# Patient Record
Sex: Male | Born: 1945 | Race: White | Hispanic: No | Smoking: Former smoker
Health system: Southern US, Community
[De-identification: ages and names within clinical notes are randomized; demographics above are authoritative.]

## PROBLEM LIST (undated history)

## (undated) DIAGNOSIS — Z889 Allergy status to unspecified drugs, medicaments and biological substances status: Secondary | ICD-10-CM

## (undated) DIAGNOSIS — I499 Cardiac arrhythmia, unspecified: Secondary | ICD-10-CM

## (undated) DIAGNOSIS — E119 Type 2 diabetes mellitus without complications: Secondary | ICD-10-CM

## (undated) DIAGNOSIS — Z9289 Personal history of other medical treatment: Secondary | ICD-10-CM

## (undated) DIAGNOSIS — C61 Malignant neoplasm of prostate: Secondary | ICD-10-CM

## (undated) DIAGNOSIS — J45909 Unspecified asthma, uncomplicated: Secondary | ICD-10-CM

## (undated) DIAGNOSIS — J449 Chronic obstructive pulmonary disease, unspecified: Secondary | ICD-10-CM

## (undated) DIAGNOSIS — M25519 Pain in unspecified shoulder: Secondary | ICD-10-CM

## (undated) DIAGNOSIS — J189 Pneumonia, unspecified organism: Secondary | ICD-10-CM

## (undated) DIAGNOSIS — M5430 Sciatica, unspecified side: Secondary | ICD-10-CM

## (undated) HISTORY — PX: OTHER SURGICAL HISTORY: SHX169

## (undated) HISTORY — PX: APPENDECTOMY: SHX54

## (undated) HISTORY — PX: ESOPHAGOGASTRODUODENOSCOPY ENDOSCOPY: SHX5814

## (undated) HISTORY — PX: TONSILLECTOMY: SUR1361

## (undated) HISTORY — PX: PROSTATE SURGERY: SHX751

---

## 2011-01-20 ENCOUNTER — Inpatient Hospital Stay (HOSPITAL_COMMUNITY)
Admission: AD | Admit: 2011-01-20 | Discharge: 2011-01-28 | DRG: 682 | Disposition: A | Discharge status: Home Health | Payer: Medicare Other | Source: Other Acute Inpatient Hospital | Attending: Internal Medicine | Admitting: Internal Medicine

## 2011-01-20 ENCOUNTER — Inpatient Hospital Stay (HOSPITAL_COMMUNITY): Payer: Medicare Other

## 2011-01-20 DIAGNOSIS — J449 Chronic obstructive pulmonary disease, unspecified: Secondary | ICD-10-CM | POA: Diagnosis present

## 2011-01-20 DIAGNOSIS — K7030 Alcoholic cirrhosis of liver without ascites: Secondary | ICD-10-CM | POA: Diagnosis present

## 2011-01-20 DIAGNOSIS — E875 Hyperkalemia: Secondary | ICD-10-CM | POA: Diagnosis present

## 2011-01-20 DIAGNOSIS — N189 Chronic kidney disease, unspecified: Secondary | ICD-10-CM | POA: Diagnosis present

## 2011-01-20 DIAGNOSIS — F1010 Alcohol abuse, uncomplicated: Secondary | ICD-10-CM | POA: Diagnosis present

## 2011-01-20 DIAGNOSIS — K651 Peritoneal abscess: Secondary | ICD-10-CM | POA: Diagnosis present

## 2011-01-20 DIAGNOSIS — D62 Acute posthemorrhagic anemia: Secondary | ICD-10-CM | POA: Diagnosis present

## 2011-01-20 DIAGNOSIS — K5900 Constipation, unspecified: Secondary | ICD-10-CM | POA: Diagnosis present

## 2011-01-20 DIAGNOSIS — T50995A Adverse effect of other drugs, medicaments and biological substances, initial encounter: Secondary | ICD-10-CM | POA: Diagnosis present

## 2011-01-20 DIAGNOSIS — E119 Type 2 diabetes mellitus without complications: Secondary | ICD-10-CM | POA: Diagnosis present

## 2011-01-20 DIAGNOSIS — L02219 Cutaneous abscess of trunk, unspecified: Secondary | ICD-10-CM | POA: Diagnosis present

## 2011-01-20 DIAGNOSIS — K560 Paralytic ileus: Secondary | ICD-10-CM | POA: Diagnosis present

## 2011-01-20 DIAGNOSIS — N170 Acute kidney failure with tubular necrosis: Principal | ICD-10-CM | POA: Diagnosis present

## 2011-01-20 DIAGNOSIS — T368X5A Adverse effect of other systemic antibiotics, initial encounter: Secondary | ICD-10-CM | POA: Diagnosis present

## 2011-01-20 DIAGNOSIS — I129 Hypertensive chronic kidney disease with stage 1 through stage 4 chronic kidney disease, or unspecified chronic kidney disease: Secondary | ICD-10-CM | POA: Diagnosis present

## 2011-01-20 DIAGNOSIS — C61 Malignant neoplasm of prostate: Secondary | ICD-10-CM | POA: Diagnosis present

## 2011-01-20 DIAGNOSIS — F17200 Nicotine dependence, unspecified, uncomplicated: Secondary | ICD-10-CM | POA: Diagnosis present

## 2011-01-20 DIAGNOSIS — Z8614 Personal history of Methicillin resistant Staphylococcus aureus infection: Secondary | ICD-10-CM

## 2011-01-20 DIAGNOSIS — E871 Hypo-osmolality and hyponatremia: Secondary | ICD-10-CM | POA: Diagnosis present

## 2011-01-20 DIAGNOSIS — J4489 Other specified chronic obstructive pulmonary disease: Secondary | ICD-10-CM | POA: Diagnosis present

## 2011-01-20 LAB — CARDIAC PANEL(CRET KIN+CKTOT+MB+TROPI)
Relative Index: INVALID (ref 0.0–2.5)
Total CK: 38 U/L (ref 7–232)
Troponin I: <0.01 ng/mL (ref 0.00–0.06)

## 2011-01-20 LAB — BASIC METABOLIC PANEL
Calcium: 7.8 mg/dL — ABNORMAL LOW (ref 8.4–10.5)
GFR calc Af Amer: 11 mL/min — ABNORMAL LOW (ref >60)
GFR calc non Af Amer: 9 mL/min — ABNORMAL LOW (ref >60)
Sodium: 132 mEq/L — ABNORMAL LOW (ref 135–145)

## 2011-01-21 ENCOUNTER — Other Ambulatory Visit (HOSPITAL_COMMUNITY): Payer: Medicare Other

## 2011-01-21 ENCOUNTER — Other Ambulatory Visit: Payer: Self-pay | Admitting: Family Medicine

## 2011-01-21 DIAGNOSIS — M7989 Other specified soft tissue disorders: Secondary | ICD-10-CM

## 2011-01-21 LAB — CBC
HCT: 27.6 % — ABNORMAL LOW (ref 39.0–52.0)
MCV: 91.1 fL (ref 78.0–100.0)
Platelets: 191 10*3/uL (ref 150–400)
RBC: 3.03 MIL/uL — ABNORMAL LOW (ref 4.22–5.81)
WBC: 5.7 10*3/uL (ref 4.0–10.5)

## 2011-01-21 LAB — COMPREHENSIVE METABOLIC PANEL
ALT: 12 U/L (ref 0–53)
AST: 18 U/L (ref 0–37)
CO2: 19 mEq/L (ref 19–32)
Calcium: 7.9 mg/dL — ABNORMAL LOW (ref 8.4–10.5)
GFR calc Af Amer: 10 mL/min — ABNORMAL LOW (ref >60)
GFR calc non Af Amer: 8 mL/min — ABNORMAL LOW (ref >60)
Sodium: 133 mEq/L — ABNORMAL LOW (ref 135–145)

## 2011-01-21 LAB — VITAMIN B12: Vitamin B-12: 921 pg/mL — ABNORMAL HIGH (ref 211–911)

## 2011-01-21 LAB — CK TOTAL AND CKMB (NOT AT ARMC)
CK, MB: 1.5 ng/mL (ref 0.3–4.0)
Relative Index: INVALID (ref 0.0–2.5)
Total CK: 42 U/L (ref 7–232)

## 2011-01-21 LAB — IRON AND TIBC
Iron: 38 ug/dL — ABNORMAL LOW (ref 42–135)
TIBC: 117 ug/dL — ABNORMAL LOW (ref 215–435)

## 2011-01-21 LAB — FERRITIN: Ferritin: 368 ng/mL — ABNORMAL HIGH (ref 22–322)

## 2011-01-21 LAB — C3 COMPLEMENT: C3 Complement: 17 mg/dL — ABNORMAL LOW (ref 88–201)

## 2011-01-21 LAB — CREATININE, URINE, RANDOM: Creatinine, Urine: 45.5 mg/dL

## 2011-01-21 LAB — GLUCOSE, CAPILLARY: Glucose-Capillary: 84 mg/dL (ref 70–99)

## 2011-01-21 LAB — FOLATE: Folate: 11.4 ng/mL

## 2011-01-22 ENCOUNTER — Inpatient Hospital Stay (HOSPITAL_COMMUNITY): Payer: Medicare Other

## 2011-01-22 DIAGNOSIS — L0291 Cutaneous abscess, unspecified: Secondary | ICD-10-CM

## 2011-01-22 DIAGNOSIS — A4902 Methicillin resistant Staphylococcus aureus infection, unspecified site: Secondary | ICD-10-CM

## 2011-01-22 DIAGNOSIS — L0390 Cellulitis, unspecified: Secondary | ICD-10-CM

## 2011-01-22 LAB — CBC
HCT: 26.2 % — ABNORMAL LOW (ref 39.0–52.0)
Platelets: 197 10*3/uL (ref 150–400)
RBC: 2.89 MIL/uL — ABNORMAL LOW (ref 4.22–5.81)
RDW: 15.4 % (ref 11.5–15.5)
WBC: 5 10*3/uL (ref 4.0–10.5)

## 2011-01-22 LAB — COMPREHENSIVE METABOLIC PANEL
ALT: 11 U/L (ref 0–53)
Albumin: 2.1 g/dL — ABNORMAL LOW (ref 3.5–5.2)
Alkaline Phosphatase: 49 U/L (ref 39–117)
Chloride: 109 mEq/L (ref 96–112)
Glucose, Bld: 82 mg/dL (ref 70–99)
Potassium: 5.5 mEq/L — ABNORMAL HIGH (ref 3.5–5.1)
Sodium: 135 mEq/L (ref 135–145)
Total Protein: 5.6 g/dL — ABNORMAL LOW (ref 6.0–8.3)

## 2011-01-22 LAB — GLUCOSE, CAPILLARY: Glucose-Capillary: 105 mg/dL — ABNORMAL HIGH (ref 70–99)

## 2011-01-23 LAB — CBC
MCH: 30.8 pg (ref 26.0–34.0)
MCHC: 34 g/dL (ref 30.0–36.0)
MCV: 90.4 fL (ref 78.0–100.0)
Platelets: 225 10*3/uL (ref 150–400)
RDW: 15.3 % (ref 11.5–15.5)

## 2011-01-23 LAB — RENAL FUNCTION PANEL
Albumin: 2.4 g/dL — ABNORMAL LOW (ref 3.5–5.2)
BUN: 40 mg/dL — ABNORMAL HIGH (ref 6–23)
Creatinine, Ser: 7.2 mg/dL — ABNORMAL HIGH (ref 0.4–1.5)
Phosphorus: 8.3 mg/dL — ABNORMAL HIGH (ref 2.3–4.6)

## 2011-01-23 LAB — HEPATITIS PANEL, ACUTE
HCV Ab: NEGATIVE
Hep B C IgM: NEGATIVE
Hepatitis B Surface Ag: NEGATIVE

## 2011-01-23 LAB — HIV ANTIBODY (ROUTINE TESTING W REFLEX): HIV: NONREACTIVE

## 2011-01-24 LAB — CBC
HCT: 25.4 % — ABNORMAL LOW (ref 39.0–52.0)
Hemoglobin: 8.7 g/dL — ABNORMAL LOW (ref 13.0–17.0)
MCH: 30.7 pg (ref 26.0–34.0)
MCHC: 34.3 g/dL (ref 30.0–36.0)
MCV: 89.8 fL (ref 78.0–100.0)
RDW: 15.1 % (ref 11.5–15.5)

## 2011-01-24 LAB — GLUCOSE, CAPILLARY
Glucose-Capillary: 89 mg/dL (ref 70–99)
Glucose-Capillary: 101 mg/dL — ABNORMAL HIGH (ref 70–99)
Glucose-Capillary: 124 mg/dL — ABNORMAL HIGH (ref 70–99)

## 2011-01-24 LAB — DIFFERENTIAL
Eosinophils Relative: 2 % (ref 0–5)
Lymphocytes Relative: 23 % (ref 12–46)
Monocytes Absolute: 0.4 10*3/uL (ref 0.1–1.0)
Monocytes Relative: 11 % (ref 3–12)
Neutro Abs: 2.3 10*3/uL (ref 1.7–7.7)

## 2011-01-24 LAB — RENAL FUNCTION PANEL
BUN: 42 mg/dL — ABNORMAL HIGH (ref 6–23)
CO2: 20 mEq/L (ref 19–32)
Calcium: 7.7 mg/dL — ABNORMAL LOW (ref 8.4–10.5)
Creatinine, Ser: 7.49 mg/dL — ABNORMAL HIGH (ref 0.4–1.5)
Glucose, Bld: 97 mg/dL (ref 70–99)
Sodium: 130 mEq/L — ABNORMAL LOW (ref 135–145)

## 2011-01-25 LAB — DIFFERENTIAL
Basophils Relative: 2 % — ABNORMAL HIGH (ref 0–1)
Eosinophils Relative: 3 % (ref 0–5)
Monocytes Absolute: 0.4 10*3/uL (ref 0.1–1.0)
Monocytes Relative: 9 % (ref 3–12)
Neutro Abs: 2.5 10*3/uL (ref 1.7–7.7)

## 2011-01-25 LAB — GLUCOSE, CAPILLARY
Glucose-Capillary: 104 mg/dL — ABNORMAL HIGH (ref 70–99)
Glucose-Capillary: 110 mg/dL — ABNORMAL HIGH (ref 70–99)

## 2011-01-25 LAB — RETICULOCYTES: Retic Ct Pct: 0.5 % (ref 0.4–3.1)

## 2011-01-25 LAB — CBC
HCT: 27.8 % — ABNORMAL LOW (ref 39.0–52.0)
Hemoglobin: 9.6 g/dL — ABNORMAL LOW (ref 13.0–17.0)
MCH: 30.8 pg (ref 26.0–34.0)
MCHC: 34.5 g/dL (ref 30.0–36.0)

## 2011-01-25 LAB — BASIC METABOLIC PANEL
CO2: 20 mEq/L (ref 19–32)
Calcium: 7.9 mg/dL — ABNORMAL LOW (ref 8.4–10.5)
Creatinine, Ser: 7.14 mg/dL — ABNORMAL HIGH (ref 0.4–1.5)
GFR calc Af Amer: 9 mL/min — ABNORMAL LOW (ref >60)
Glucose, Bld: 96 mg/dL (ref 70–99)

## 2011-01-25 NOTE — Discharge Summary (Signed)
  NAMEVIAAN, KNIPPENBERG               ACCOUNT NO.:  [id number]  MEDICAL RECORD NO.:  [id number]           PATIENT TYPE:  I  LOCATION:  6738                         FACILITY:  [facility]  PHYSICIAN:  Pleas Koch, MD        DATE OF BIRTH:  Jun 04, 1946  DATE OF ADMISSION:  01/20/2011 DATE OF DISCHARGE:                              DISCHARGE SUMMARY   CURRENT FACILITY:  [facility].  DISCHARGE DIAGNOSES: 1. Cellulitis of left great toe. 2. Diabetes mellitus, uncontrolled, with A1c of 7.6. 3. Hypertension. 4. 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 5. Possible peripheral vascular disease.  Dictation ended at this point, wrong patient.          ______________________________ Pleas Koch, MD     JS/MEDQ  D:  01/22/2011  T:  01/22/2011  Job:  [id number]  Electronically Signed by Pleas Koch MD on 01/25/2011 06:19:51 AM

## 2011-01-26 DIAGNOSIS — A4902 Methicillin resistant Staphylococcus aureus infection, unspecified site: Secondary | ICD-10-CM

## 2011-01-26 DIAGNOSIS — L0291 Cutaneous abscess, unspecified: Secondary | ICD-10-CM

## 2011-01-26 DIAGNOSIS — L0390 Cellulitis, unspecified: Secondary | ICD-10-CM

## 2011-01-26 LAB — GLUCOSE, CAPILLARY: Glucose-Capillary: 87 mg/dL (ref 70–99)

## 2011-01-26 LAB — CBC
MCHC: 34.5 g/dL (ref 30.0–36.0)
RDW: 15 % (ref 11.5–15.5)

## 2011-01-26 LAB — RENAL FUNCTION PANEL
Calcium: 7.6 mg/dL — ABNORMAL LOW (ref 8.4–10.5)
GFR calc Af Amer: 10 mL/min — ABNORMAL LOW (ref >60)
GFR calc non Af Amer: 8 mL/min — ABNORMAL LOW (ref >60)
Glucose, Bld: 97 mg/dL (ref 70–99)
Phosphorus: 9 mg/dL — ABNORMAL HIGH (ref 2.3–4.6)
Sodium: 136 mEq/L (ref 135–145)

## 2011-01-27 ENCOUNTER — Inpatient Hospital Stay (HOSPITAL_COMMUNITY): Payer: Medicare Other

## 2011-01-27 LAB — GLUCOSE, CAPILLARY

## 2011-01-27 LAB — RENAL FUNCTION PANEL
BUN: 39 mg/dL — ABNORMAL HIGH (ref 6–23)
Glucose, Bld: 91 mg/dL (ref 70–99)
Phosphorus: 8.6 mg/dL — ABNORMAL HIGH (ref 2.3–4.6)
Potassium: 4.3 mEq/L (ref 3.5–5.1)

## 2011-01-28 LAB — GLUCOSE, CAPILLARY
Glucose-Capillary: 106 mg/dL — ABNORMAL HIGH (ref 70–99)
Glucose-Capillary: 110 mg/dL — ABNORMAL HIGH (ref 70–99)

## 2011-01-28 LAB — COMPREHENSIVE METABOLIC PANEL
ALT: 9 U/L (ref 0–53)
AST: 16 U/L (ref 0–37)
Albumin: 2.1 g/dL — ABNORMAL LOW (ref 3.5–5.2)
Alkaline Phosphatase: 44 U/L (ref 39–117)
GFR calc Af Amer: 11 mL/min — ABNORMAL LOW (ref >60)
Potassium: 4.5 mEq/L (ref 3.5–5.1)
Sodium: 135 mEq/L (ref 135–145)
Total Protein: 5.7 g/dL — ABNORMAL LOW (ref 6.0–8.3)

## 2011-01-29 NOTE — Discharge Summary (Signed)
Donald Hodges, BAGOT               ACCOUNT NO.:  [id number]  MEDICAL RECORD NO.:  [id number]           PATIENT TYPE:  LOCATION:                                 FACILITY:  PHYSICIAN:  Marinda Elk, M.D.DATE OF BIRTH:  November 27, 1945  DATE OF ADMISSION: DATE OF DISCHARGE:                              DISCHARGE SUMMARY   PRIMARY CARE DOCTOR:  Dr. Sherrie Mustache over at [facility].  DISCHARGE DIAGNOSES: 1. Acute tubular necrosis, multifactorial. 2. Possible pelvic abscess status post drainage at [facility]. 3. Hypertension. 4. Diabetes type 2. 5. Anemia of chronic disease.  DISCHARGE MEDICATIONS: 1. Amlodipine 10 mg p.o. daily. 2. Clonidine 0.1 mg p.o. b.i.d. 3. Daptomycin 700 mg every other day. 4. Lasix 80 mg daily. 5. Lantus 5 units b.i.d. 6. Cod liver oil cap daily. 7. Multivitamin 1 tablet daily. 8. Osteo Bi-Flex bone 1 tablet daily. 9. Dissolve multivitamin over-the-counter daily.  PROCEDURES PERFORMED: 1. January 27, 2011, x-ray show tip of the right PICC line and proximal     SVC. 2. CT scan of the abdomen and pelvis shows there is no evidence of     abscess around the drainage catheter in the right lower quadrant.     There is no residual fluid around the tip of the catheter, fluid     collection in the lesser sac, in the right mid abdomen, in the left     lower quadrant as described.  There is extensive diverticulosis     with no diverticulitis.  Varices in the upper quadrant, slightly     irregularity of the liver parenchyma, which may represent     cirrhosis. 3. Abdominal ultrasound show small amount of fluid of the liver edge,     good genic appearance of the renal parenchyma suggest chronic     medical kidney disease. 4. Shallow inspiration, no evidence of active pulmonary disease.  CONSULTANTS: 1. General Surgery. 2. [facility].  BRIEF ADMITTING H AND P:  This is a 65 year old male with past medical history of  hypertension, diabetes type 2, COPD, tobacco, and alcoholism, status post prostate cancer, February 16 from the [facility], subsequently he was discharged home after 8 days of doing fine.  A week prior to admission, the patient started having some abdominal pain mostly in the pelvic area, in the right lower quadrant area.  The patient went to [facility], CT scan was done that showed pelvic abscess and the patient was admitted to [facility].  Subsequently, underwent pelvic drainage and debridement of an abscess that grew MRSA.  The patient was placed on vancomycin and Levaquin.  The patient's course in the hospital show that the patient was progressive getting worse.  Renal function was deteriorating.  At that time, Dr. Hyman Hopes, Nephrology was consulted over the phone and he advised the patient to be transferred to [facility].  The patient at this time denies any shortness of breath, nausea, or vomiting.  Denies any chest pain.  Denies any headache or visual symptoms.  Denies any abdominal pain, except for pain in pelvic area.  We were asked to admit  and further evaluate.  PHYSICAL EXAM:  VITAL SIGNS:  Blood pressure 127/68, respiration 18, and 97% on room air, pulse of 60. GENERAL:  He is awake, alert, and oriented x3, in no acute distress. HEENT:  Normocephalic, anicteric. LUNGS:  Good air movement.  Clear to auscultation. CARDIOVASCULAR:  Regular rate and rhythm with positive S1-S2.  No murmurs, rubs or gallops. ABDOMEN:  Soft, mildly tender in the right lower quadrant.  There is also a drain placed in the right upper quadrant.  No guarding or rigidity. CNS:  Alert, awake, and oriented.  Nonfocal.  LABS ON ADMISSION:  Sodium 132, potassium 5.7, chloride 108, bicarb of 18, glucose of 100, BUN of 37, creatinine 6.4, calcium is 7.8.  Point-of- cardiac care markers negative x1.  His MRSA was positive.  His urine creatinine was 45.  His urine sodium was 105.  His white count was  5.7, hemoglobin 9.3, and platelet count 191.  BRIEF HOSPITAL COURSE: 1. Acute tubular necrosis, multifactorial, probably secondary to     contrast, vancomycin.  Renal was consulted,  He was treated      conservatively.  He improved.  His urine     output improved.  His creatinine started to trend down.  He was     started on Lasix and his creatinine was continued to trend down.     He will follow up with his primary care doctor as an outpatient.     His diuretic was titrated up.  Renal recommended to follow up BMET,     to follow up on his creatinine by his PCP, and titrate his Lasix as     needed. 2. Possible pelvic abscess, status post drainage in [facility].     ID was consulted.  They stop the vancomycin.  They recommended     daptomycin.  Now, they got a CT that showed no abscesses.  The     drain was removed.  Daiva Eves recommended daptomycin for 3 weeks.  He     will continue his IV at home.  Dr. Daiva Eves is to check and follow     up with his creatinine, to arrange dose of daptomycin as needed. 3. Hypertension, currently well controlled, no changes were made. 4. Diabetes type 2.  Currently, his diabetes medications were stopped.    He was started on Lantus 5 units twice a day.  He will follow up     with his PCP and titrate his Lantus as his creatinine continues to     improve and he would need more, and check CBG a.c. and h.s. 5. Anemia of chronic disease.  This will be followed up by his PCP.  DISPOSITION:  The patient will follow up with his primary care doctor next week.  He will check a BMET and titrate his Lantus as needed and also if his creatinine continues to improve and  his edema has decrease,   we will titrate his Lasix down.  His creatinine at the time of discharge  was 6.7, it peak at 7.04.  His blood pressure at the time of discharge was  155.    VITALS ON DAY OF DISCHARGE:  Temperature 98, pulse 67, respiration 18, blood pressure 155/87.  He was satting  98% on room air.  LABS ON DAY OF DISCHARGE:  Sodium 135, potassium 4.5, chloride 107, bicarb 21, glucose of 97, BUN 40, creatinine 0.3, and LFTs within normal limits, except for his albumin which is 2.1.  Marinda Elk, M.D.     AF/MEDQ  D:  01/28/2011  T:  01/29/2011  Job:  [id number]  cc:   Dr. Sherrie Mustache  Electronically Signed by Lambert Keto M.D. on 01/29/2011 02:58:44 PM

## 2011-01-30 NOTE — Consult Note (Addendum)
NAMEFIN, HUPP               ACCOUNT NO.:  [id number]  MEDICAL RECORD NO.:  [id number]           PATIENT TYPE:  LOCATION:                                 FACILITY:  PHYSICIAN:  Pleas Koch, MD        DATE OF BIRTH:  1946-02-08  DATE OF CONSULTATION: DATE OF DISCHARGE:                                CONSULTATION   REQUESTING PHYSICIAN:  Pleas Koch, MD  REASON FOR INFECTIOUS DISEASE CONSULTATION:  The patient with intraabdominal abscess with methicillin-resistant Staphylococcus aureus after radical prostatectomy.  HISTORY OF PRESENT ILLNESS:  Mr. Diekman is a 65 year old Caucasian male with a history of heavy alcoholism who had prostate cancer and underwent a radical prostatectomy at [facility] in February.  He had been doing well until mid March 2012, when he was admitted on January 09, 2011, with lower abdominal pain and serous physiology white count of 30,000 and found to have a large intraabdominal abscess on CT scan.  He was actually seen initially at [facility] and then transferred to [facility].  CT scan had shown a large intraabdominal abscess.  This was drained under CT guidance and drain was placed.  He was placed initially on vancomycin and Zosyn.  Initial drainage was on January 09, 2011.  200 mL of fluid was removed at initial drain.  He was treated with antibiotics at [facility] and had serial CT scans including one I performed on January 15, 2011, which showed resolution in the size of an abscess in the retroperitoneum in the right side of low pelvis where the drainage catheter was in place.  There was a small stable fluid collection in the left pelvic sidewall on the obturator region measuring 2.5 x 2.2 cm in dimensions as well as a small subcutaneous abscess on the right side of the low pelvis overlying the right inferior rectus muscle, decrease in size from prior exam and now at 2.1 x 3.7 that was previously 2.3 x 3.9 cm.   He was continued on antibiotics, but then developed acute renal failure with creatinine continuing to rise.  He was taken off vancomycin and ultimately transferred to [facility] for further care.  His purulent fluid that is collecting in his drainage tube has been repeatedly sampled over at [facility] and has been growing MRSA on every culture that has been collected including one as recent as January 18, 2011.  Here at [facility], he has been on Zosyn, but not anti-methicillin-resistant Staph aureus therapy.  We are asked to see this patient and assist in workup and management of a methicillin-resistant Staph aureus intraabdominal abscess.  He does have an area of fluctuance superficially that is also palpable on exam and this is being worked up as well.  PAST MEDICAL HISTORY: 1. Heavy alcoholism. 2. Prostate cancer, status post prostatectomy. 3. Renal failure, thought to be related to vancomycin toxicity. 4. Hyponatremia. 5. Diabetes mellitus,. 6. History of polysubstance abuse.  PAST SURGICAL HISTORY:  Prostatectomy as described above.  ALLERGIES:  No known drug allergies.  CURRENT MEDICATIONS:  Norvasc, chlorhexidine, clonidine, clotrimazole, mupirocin, Kayexalate, Zosyn, Tylenol,  albuterol, hydralazine, Ativan, morphine, and Zofran.  REVIEW OF SYSTEMS:  As described in the history of present illness, otherwise a 12-point review of systems is negative.  PHYSICAL EXAMINATION:  VITAL SIGNS:  Temperature max in the last 24 hours is 98.9, temperature current 98.4, blood pressure 136/75, pulse 69, respirations 20, and pulse ox 95% on room air. GENERAL:  A quite pleasant gentleman, in no acute distress. HEENT:  Normocephalic and atraumatic.  Pupils are equal, round, and reactive to light.  Sclerae are anicteric.  Oropharynx is clear. NECK:  Supple. CARDIOVASCULAR:  Regular rate and rhythm.  No murmurs, gallops, or rubs. LUNGS:  Clear to auscultation bilaterally without rales,  rhonchi, or rales. ABDOMEN:  Soft, slightly protuberant, lower abdominal wall with a raised area of fluctuance and erythema concerning for MRSA abscess.  His midline incision is actually clean, dry, and intact.  His skin in the lower abdomen is also a bit doughy in consistency. EXTREMITIES:  With 2+ edema.  LABORATORY DATA:  CT of the abdomen and pelvis have been ordered today. Ultrasound done today shows small amount of fluid along the liver edge and the appearance of the kidneys reveals medical renal disease.  Chest x-ray shows no active pulmonary disease.  Comprehensive metabolic panel:  Sodium 135, potassium 5.5, chloride 109, bicarb 19, BUN and creatinine 41 and 7.04, and glucose 82. Transaminases normal.  Albumin was low at 2.18.  ASO titer was 47, C3 of 20, C4 of 17.  Vancomycin random level was 37.7.  On the fifth, CK-MB was 42 and 1.5 respectively.  MRSA/PCR screen was positive on admission.  MICROBIOLOGICAL DATA:  None from [facility] itself, but from [place] there are multiple positive methicillin-resistant Staph aureus cultures. Culture abscess on January 18, 2011, methicillin-resistant Staph aureus. Culture on January 17, 2011, methicillin-resistant Staph aureus, sensitive to vancomycin, Bactrim, and linezolid.  Culture on January 16, 2011, methicillin-resistant Staph aureus once again with same sensitivities. Urine culture on January 16, 2011, less than 10,000 colony forming units of yeast.  Blood culture on January 09, 2011, no growth 2/2 blood cultures.  Abscess culture on January 12, 2011, methicillin-resistant Staph aureus.  IMPRESSION/RECOMMENDATIONS:  This is a 65 year old gentleman with alcoholism who underwent prostatectomy for prostate cancer and developed intraabdominal abscess with methicillin-resistant Staphylococcus aureus.  Methicillin-resistant Staphylococcus aureus abscess:  Given that he developed renal toxicity, it was thought due to vancomycin and we will not give  further vancomycin and his trough is actually quite high right now.  I would change him over to Cubicin at 6 mg/kg the can be dosed by Pharmacy with given his current renal function likely be every other day.  We will need serum CPKs monitored.  He will need followup monitoring of his intraabdominal cavity with imaging.  I am concerned right now given the area that is quite tender on exam that he may need further I and D done here by Surgery.  We will first see what the scan shows.  I would make sure that he has had at least a month if not 6 weeks of effective anti-methicillin-resistant Staph aureus therapy.  The abscess was initially drained on January 09, 2011, giving him almost 2 weeks of anti-methicillin- resistant Staph aureus therapy.  Again, I think he needs at least another 2 if not 4 weeks of therapy.  Thank you for this infectious disease consultation.  My partner, Dr. Maurice March will be on over the weekend for further questions.     Acey Lav, MD  ______________________________ Pleas Koch, MD    CV/MEDQ  D:  01/22/2011  T:  01/23/2011  Job:  [id number]  cc:   Terrial Rhodes, M.D.  Electronically Signed by Pleas Koch MD on 01/30/2011 07:32:43 AM Electronically Signed by Paulette Blanch DAM MD on 02/15/2011 08:46:02 PM

## 2011-01-30 NOTE — Progress Notes (Signed)
Donald Hodges, Donald Hodges               ACCOUNT NO.:  [id number]  MEDICAL RECORD NO.:  [id number]           PATIENT TYPE:  I  LOCATION:  6738                         FACILITY:  [facility]  PHYSICIAN:  Pleas Koch, MD        DATE OF BIRTH:  January 19, 1946                                PROGRESS NOTE   DIAGNOSES: Up-to-date are as follows: 1. Acute renal insufficiency. 2. Possible pelvic abscess status post drainage at [facility]     and now subcutaneous erythema 3. Hypertension. 4. Diabetes mellitus type 2, A1c of 6.5. 5. Carcinoma of the prostate status post resection November 23, 2010. 6. Anemia of chronic disease. 7. History of heavy ethanol use. 8. Lower extremity swelling.  CURRENT MEDICATIONS: To date: 1. Amlodipine 10 mg daily. 2. Clonidine 0.1 mg q.8. 3. Clotrimazole 1 application daily. 4. Heparin 5000 units daily. 5. Insulin sliding scale. 6. Mupirocin 1 application  b.i.d. 7. Thiamine 1 tablet daily. 8. Morphine sulfate 2-4 mg q.4 p.r.n. IV.  PERTINENT IMAGING STUDIES: 1. Portable chest x-ray 2011-01-25 = shallow respiration.  No     evidence of active pulmonary disease. 2. Ultrasound abdomen January 22, 2011, with a amount of fluid along the     liver edge representing appearance of right renal parenchyma     suggesting medical renal disease.  Examination otherwise was     unremarkable. 3. CT abdomen, January 22, 2011.     a.     No evidence of abscess around drainage cath from the right      lower quadrant.  No evidence of fluid around the tip of the      catheter.     b.     Fluid collection right abdomen and left lower quadrant as      described.  This may represent loculated ascites, cannot exclude      abscess on the noncontrast head. iii  Extensive diverticulosis. 1. Varices of left upper quadrant. 2. Slight irregularity of liver parenchyma which might represent     cirrhosis. 3. Anasarca. 4. Chronic calcific pancreatitis.  PERTINENT CONSULTATIONS: Dr.  Terrial Rhodes of Nephrology, Dr. Donell Beers of Surgery, Dr. Daiva Eves of Infectious Disease.  This is, briefly, a 65 year old unfortunate male who was transferred from [facility] subsequent to a CT scan being drained January 09, 2011.  Started have increasing abdominal pain, was placed on vancomycin for MRSA and also on Levaquin.  He began to deteriorate in terms of renal function at baseline creatinine being 0.62 up to a high of 5.14 and also developed hyponatremia.  He has a remote history earlier this year, December 03, 2010 having of radical prostatectomy.  Please see my full dictation.  HOSPITAL COURSE ACCORDING TO ISSUE: 1. Pelvic abscess.  I did consult Infectious Disease as well as     Surgery.  Surgery did not recommend acute intervention at this time     and they reviewed the scans and examined the patient.  They     recommend medical management for now if needed and subcutaneous     abscess does indeed form, they  will revisit this issue.  However,     there is no acute or surgical intervention to be planned. 2. Dr. Daiva Eves recommended that the patient will need to be on at     least 3 weeks of  daptomycin.  Has been on antibiotics from January 12, 2011.  However, did not get antibiotics on day subsequent to     admission as I wanted to see what his abscess would do.  Therefore,     the patient will need 3 weeks from January 12, 2011 in terms of     antibiotics. He will need a PICC line which has not been placed yet. 1. Possible volume overload.  The patient was on diuretics at [facility] and may need to have a low-dose diuretic added to his     regimen as he is developing some lower extremity swelling as well.     It would be reasonable to add low-dose Lasix.  Please consult     nephrologist and nephrologist should be involved in this discussion     prior to discharge home. 2. Hypertension.  Blood pressures are very stable in the hospital in     140s-150s  over 70s-80s.  The patient would likely benefit from     either addition or uptitration of his medications without any     nephrotoxic effect. 3. Carcinoma of the prostate.  The patient will need to follow up as     an outpatient with Urology.  It is unclear to me at this time     whether he will need a catheter for a short period of time until     everything heals and this likely might be the case. 4. Anemia blood loss versus anemia chronic disease.  We will defer     this issue for now to Renal Service.  However, his hemoglobin is     stable at around 8-9. 5. Heavy ethanol history in the past.  This is not a current diagnosis     and the patient has quit from using this. 6. Constipation.  We will give the patient Fleet Enema and lactulose     for this. 7. Diabetes mellitus, A1c of 6.5.  The patient would like a monitor     prior to discharge and we will order this for him.  The patient     will be seen by discharging physician, Dr. David Stall.  On day of     discharge medications will be fully reconciled, the only possible     addition being a low-dose diuretic, and the patient will need to     complete antibiotics as previously dictated subsequent to PICC line     being placed.          ______________________________ Pleas Koch, MD     JS/MEDQ  D:  01/26/2011  T:  01/26/2011  Job:  [id number]  Electronically Signed by Pleas Koch MD on 01/30/2011 07:33:44 AM

## 2011-02-02 NOTE — Consult Note (Signed)
NAMENELSON, NOONE               ACCOUNT NO.:  [id number]  MEDICAL RECORD NO.:  [id number]           PATIENT TYPE:  I  LOCATION:  6738                         FACILITY:  [facility]  PHYSICIAN:  Donald Lint, MD       DATE OF BIRTH:  08-Oct-1946  DATE OF CONSULTATION:  01/25/2011 DATE OF DISCHARGE:                                CONSULTATION   CHIEF COMPLAINT:  Abdominal wall lesion.  HISTORY OF PRESENT ILLNESS:  Donald Hodges is a 65 year old male who underwent a prostatectomy at [facility] in February 2012.  He has history of heavy alcohol use and prostate cancer.  In mid March, he was admitted with lower abdominal pain and leukocytosis and a CT showed a large intraabdominal abscess in the right upper quadrant.  A drain was placed and he was placed on vancomycin and Zosyn.  He got resolution of this abscess, then developed a new abscess since that time.  He eventually developed acute renal failure with vancomycin and this was stopped and he was switched over to daptomycin.  He was transferred to [facility] for a care with renal failure.  He continues to grow MRSA from his cultures of his drain.  It has been sampled several times and we were asked to see him for a concerning area on his abdominal wall.  He has not recently had fevers.  He is having bowel functions, able to eat.  He has not been dialyzed yet. He came in with hyponatremia down to 113.  This has been followed by renal and is improving.  PAST MEDICAL HISTORY:  Significant for alcoholism, prostate cancer, renal failure,  hyponatremia, diabetes and polysubstance abuse.  PAST SURGICAL HISTORY:  Appendectomy, tonsillectomy, radical prostatectomy.  ALLERGIES:  No known drug allergies.  MEDICATIONS:  Norvasc, chlorhexidine, clonidine,  clotrimazole, mupirocin, Kayexalate, Zosyn, Tylenol, albuterol, hydralazine, Ativan, morphine, and daptomycin.  SOCIAL HISTORY:  He does smoke and drinks heavily.  He does not  abuse substances.  FAMILY HISTORY:  Significant for colon cancer in his uncle and was recommended to have colon cancer screening.  PHYSICAL EXAMINATION:  VITAL SIGNS:  Temperature is documented at [facility] going back to April 4.  He has no fevers.  His T-max was 98.9, current temperature 97.9, pulse 56, respiratory rate 20, blood pressure 147/81, sats 96% on room air.  His drain has not been putting out significant amount, about 15 in the last 24 hours. GENERAL:  He is alert and oriented x3, no acute distress. HEENT:  Normocephalic, atraumatic.  Sclerae anicteric. HEART:  Regular. ABDOMEN:  Soft, but extended.  The drain in the right upper quadrant has clear yellow serous drainage and midline incision has some heaped up tissue at the lower portion, but no erythema in this incision or the right lower quadrant trocar incision, it is around 1.5 to 2 cm long. There is an induration near the incision, but no fluctuance.  This is not felt like it is necessarily right at the surface where the wound is.  LABORATORY FINDINGS:  CBC; white count 12.1, hemoglobin 9.6, hematocrit 27.8, platelet count 257,000.  Sodium 137, potassium 4.1,  chloride 102, CO2 20, glucose 96, BUN 41, creatinine 7.4.  CT scan has free fluid collections, one is in the right subhepatic space that is 5.5 x 4 x 3.6 cm, one in the lesser sac as well that is smaller and one in the left lower quadrant 9 x 2 x 2 cm.  No evidence of abdominal wall fluid collection and this is from April 6.  Also he may have cirrhosis based on irregularity of the liver parenchyma on CT in various views of the left quadrant.  Also he has chronic calcific pancreatitis.  ASSESSMENT AND PLAN:  [name] is a 65 year old male with acute renal failure status post vancomycin use for intraabdominal MRSA-containing fluid collection.  We were asked to see him about a potential abscess in the abdominal wall.  Based on lack of erythema and fluctuance, I  will not open this incision.  These are usually low yield and do not reveal drainage or pus without the significant surrounding erythema that usually comes with this.  It is also generally painful to the patient and is difficult to heal.  We will check on this again and may be that he has an abscess that has been treated partially with the antibiotics. He certainly may develop more fluctuance in this area which may make amendable to drainage.  It is slightly tender.  Subsequently, however I would not open this wound, as I do not think that it will evacuate pus. On the matter of his intraabdominal abscesses, I would not sample the other fluid collections unless he is not getting better.  Drains can frequently continue to grow bacteria even if the fluid collection no longer contains the bacteria because the actual plastic of the drain can be colonized. I would base this more on clinical improvement and other parameters than just on resolution of the loculated ascites.     Donald Lint, MD     FB/MEDQ  D:  01/25/2011  T:  01/25/2011  Job:  [id number]  Electronically Signed by Donald Lint MD on 02/02/2011 09:42:57 PM

## 2011-02-03 ENCOUNTER — Telehealth: Payer: Self-pay | Admitting: Infectious Disease

## 2011-02-03 DIAGNOSIS — Z22322 Carrier or suspected carrier of Methicillin resistant Staphylococcus aureus: Secondary | ICD-10-CM | POA: Insufficient documentation

## 2011-02-03 DIAGNOSIS — L0291 Cutaneous abscess, unspecified: Secondary | ICD-10-CM | POA: Insufficient documentation

## 2011-02-03 DIAGNOSIS — C61 Malignant neoplasm of prostate: Secondary | ICD-10-CM

## 2011-02-03 DIAGNOSIS — IMO0002 Reserved for concepts with insufficient information to code with codable children: Secondary | ICD-10-CM

## 2011-02-03 DIAGNOSIS — F1020 Alcohol dependence, uncomplicated: Secondary | ICD-10-CM

## 2011-02-03 NOTE — Telephone Encounter (Signed)
Pt with MRSA abscess currently on daptomycin. Wish for repeat CT at beginning of May

## 2011-02-24 ENCOUNTER — Telehealth: Payer: Self-pay | Admitting: *Deleted

## 2011-02-24 NOTE — Telephone Encounter (Signed)
Home care rn called needing information re: IV rx.  Given MD pager #.  Pt's home tel. # [phone].  This numbe rgiven to [place]. Consult Service to make [facility] appt.   Jennet Maduro, RN

## 2011-02-28 NOTE — H&P (Signed)
NAMEDISHON, KEHOE               ACCOUNT NO.:  [id number]  MEDICAL RECORD NO.:  [id number]           PATIENT TYPE:  I  LOCATION:  6738                         FACILITY:  [facility]  PHYSICIAN:  Eduard Clos, MDDATE OF BIRTH:  1945-12-15  DATE OF ADMISSION:  01/20/2011 DATE OF DISCHARGE:                             HISTORY & PHYSICAL   PRIMARY CARE PHYSICIAN:  At [facility].  CHIEF COMPLAINT:  Progressive renal failure.  HISTORY OF PRESENT ILLNESS:  A 65 year old male with known history of hypertension, diabetes mellitus type 2, COPD, tobacco abuse and alcoholism, has had a prostatectomy for CA prostate on December 03, 2010, at [facility].  Subsequently, he was discharged home after 8 days and was doing fine.  A week ago, the patient started having some abdominal pain mostly in the pelvic area and the right groin area.  The patient came to [facility] ER wherein he had a CAT scan. Abdomen and pelvis done which showed pelvic abscess and the patient was admitted at [facility].  Subsequently underwent a pelvic drain placement and abscess grew MRSA.  The patient was placed on vancomycin and Levaquin.  The patient's course in the hospital showed the patient was progressively getting worsened renal function with almost becoming anuric.  At that time Dr. Hyman Hopes of Nephrology was consulted over the phone and was advised by Dr. Hyman Hopes to give Lasix.  The patient has made around 200 mL and as the patient's renal function is worsening, at this time creatinine is around 5, and he had a normal renal function and the patient becoming more oliguric/anuric, the patient has been transferred to [facility] with the patient's consent.  The patient at this time denies any shortness of breath, any nausea or vomiting.  Denies any chest pain.  Denies any headache or visual symptoms.  Denies any abdominal pain except for the pain in the pelvic area which he says largely  improved.  Denies any dysuria, discharge or diarrhea.  Denies any focal deficit.  PAST MEDICAL HISTORY:  Hypertension, diabetes mellitus type 2, COPD, ongoing tobacco abuse which he has not used for almost a month now and also alcoholism where he uses 6-10 cans of beer.  He has not been drinking for the last 10 days.  He is on alcohol withdrawal protocol.  PAST SURGICAL HISTORY:  Appendectomy, tonsillectomy and recent prostatectomy for cancer prostate.  MEDICATIONS:  The patient was on at [facility] includes Lasix 160 mg IV q.8 h. which was started today.  The patient is on sliding scale insulin.  The patient's antibiotic has been discontinued.  He was on vanc and Levaquin for MRSA abscess.  The patient is on nebulizers, albuterol and ipratropium, clonidine 0.1 mg p.o. q.6 hourly, vitamins and Ativan, alcohol withdrawal protocol.  ALLERGIES:  No known drug allergies.  SOCIAL HISTORY:  The patient smokes cigarettes, drinks alcohol.  Denies any drug abuse.  The patient has been advised to quit smoking and drinking alcohol.  FAMILY HISTORY:  Significant for colon cancer in uncle and the patient is advised about a colon cancer screening.  REVIEW OF SYSTEMS:  As per history of present illness nothing else significant.  PHYSICAL EXAMINATION:  GENERAL:  The patient is examined at bedside not in acute distress. VITAL SIGNS:  Blood pressure is 127/68, pulses is 60 per minute, temperature 98.6, respirations 18 per minute, O2 sat 97% on room air. HEENT:  Anicteric.  No pallor.  No discharge from ears, eyes, nose or mouth. CHEST:  Bilateral air entry present.  No rhonchi.  No crepitation. HEART:  S1 and S2 is heard. ABDOMEN:  Soft, mild tenderness in the right lower quadrant.  There is also a drain placed which is having some fluid in it.  No guarding, no rigidity.  Bowel sounds present. CNS:  Alert, awake and oriented to time, place, and person.  Move upper and lower extremities  5/5. EXTREMITIES:  There is 1 to 2+ edema in both lower extremities.  There is no acute ischemic changes, cyanosis or clubbing.  LABORATORY FINDINGS:  EKG has been ordered.  I am ordering a BMET stat with cardiac enzymes.  The labs which I obtained from [facility] for today shows a sodium of 132, potassium 5.6, chloride 105, carbon dioxide 17, anion gap is 10, glucose 77, BUN 33, creatinine 5.8, calcium 6.9, albumin 2.1.  CBC; WBCs 5.4, hemoglobin is 9, hematocrit is 26.9, platelets 184, eosinophils is 1, neutrophils is 76.7%.  The patient's renal sonogram done on January 18, 2011, at [place] shows normal size kidneys with no evidence of hydronephrosis and no renal lesion demonstrated, no urinary bladder lesions demonstrated.  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  X-ray of the abdomen done on January 13, 2011, shows persistent ileus.  ABI done on March 28 shows peripheral atherosclerosis, normal ABI.  Hemoglobin on January 15, 2011, was 9.2, on March 28 was 11.7.  ASSESSMENT: 1. Acute renal failure, oliguric. 2. Pelvic abscess with subcutaneous abscess in the right lower pelvis,     status post percutaneous drainage. 3. Recent prostatectomy on February 16 for cancer prostate. 4. Anemia. 5. Hypertension. 6. Diabetes type 2. 7. History of severe smoking and alcoholism. 8. History of pancreatic cirrhosis with chronic calcified     pancreatitis. 9.  Chronic obstructive pulmonary disease stable.  PLAN: 1. At this time I will admit the patient to telemetry. 2. For his acute renal failure, I have already discussed with Dr.     Elvis Coil, nephrologist on-call who is going to see the patient     later.  At this time we are going to continue with Lasix 160 IV q.8     hourly, strict intake and output, daily weights.  We are going to     repeat a BMET stat particularly to look in for hyperkalemia.  Also     we will get an EKG.  I am also going to get a cardiac enzyme     particularly to look for creatinine kinase to make sure there is no     rhabdomyolysis.  The patient at this time is oliguric, 200 mL  output in the Foley.  We are going to continue his Foley catheter     and we are going to check urine and sodium, creatinine, and     eosinophils.  At this time his acute renal failure could be from     medications or contrast induced from the CAT scan or from the     infection. Further recommendations from the nephrologist will be     followed. 3. Pelvic abscess which has been drained at this time.  Does have some     subcutaneous abscess for which at this time I am going to continue     his Zyvox.  Further recommendation, probably we will need to get     Infectious Disease until then, this patient has had some renal     failure while using vancomycin.  I will keep off vancomycin for     now.  His abscess grew MRSA. 4. Hypertension.  Keep the patient on p.r.n. hydralazine and     clonidine. 5. Diabetes mellitus type 2.  We will keep the patient on CBG checks.     He was on metformin before which we will hold off because of renal     failure. 6. The patient's x-ray was showing ileus.  At this time the patient     states he did eat burger and ice creams in the hospital of [place]     and he had no nausea or vomiting.  He did have a bowel movement.     We will closely follow and observe for this.  At this time we will     encourage  the patient on renal carb modified and heart healthy     diet. 7. I am going to get also a portable chest x-ray.  At this time the     patient is not short of breath and he does have mild fluid     overload, but I think at this time he can observe in telemetry and     further recommendation as condition evolves. 8. The patient is a full code.     Eduard Clos, MD     ANK/MEDQ  D:  01/20/2011  T:  01/20/2011  Job:  [id number]  Electronically Signed by Midge Minium MD on 02/28/2011 08:03:23 AM

## 2011-03-02 ENCOUNTER — Ambulatory Visit (INDEPENDENT_AMBULATORY_CARE_PROVIDER_SITE_OTHER): Payer: Medicare Other | Admitting: Infectious Disease

## 2011-03-02 ENCOUNTER — Encounter: Payer: Self-pay | Admitting: Infectious Disease

## 2011-03-02 VITALS — BP 146/79 | HR 73 | Temp 97.7°F | Ht 73.0 in | Wt 214.0 lb

## 2011-03-02 DIAGNOSIS — L0291 Cutaneous abscess, unspecified: Secondary | ICD-10-CM

## 2011-03-02 DIAGNOSIS — Z22322 Carrier or suspected carrier of Methicillin resistant Staphylococcus aureus: Secondary | ICD-10-CM

## 2011-03-02 DIAGNOSIS — C61 Malignant neoplasm of prostate: Secondary | ICD-10-CM

## 2011-03-02 DIAGNOSIS — N179 Acute kidney failure, unspecified: Secondary | ICD-10-CM

## 2011-03-02 MED ORDER — DOXYCYCLINE HYCLATE 100 MG PO TABS: 100.0000 mg | ORAL_TABLET | Freq: Two times a day (BID) | ORAL | Status: AC

## 2011-03-02 NOTE — Patient Instructions (Addendum)
We need to get a CT of your abdomen and pelvis In the meantime please start the doxycyline 100mg  twice daily that I have sent into [facility] in [facility] Have your main doctor fax me a CBC and CMP

## 2011-03-02 NOTE — Assessment & Plan Note (Addendum)
We will repeat a CT scan of the abdomen and pelvis without intravenous contrast reevaluate his MRSA abscesses. In the interim will change him over to doxycycline and remove his PICC line. We'll have the patient followup in one month's time. If abscesses have resolved we can then start antibiotics altogether. I expect will be some abnormalities on CT scan.

## 2011-03-02 NOTE — Assessment & Plan Note (Signed)
Status post prostatectomy as described above.

## 2011-03-02 NOTE — Assessment & Plan Note (Signed)
Is likely multifactorial but made included vancomycin as a possible culprit for his acute nephrotoxicity. Will avoid vancomycin the future.

## 2011-03-02 NOTE — Progress Notes (Signed)
Subjective:    Patient ID: Donald Hodges, male    DOB: 03/14/1946, 65 y.o.   MRN: [id number]  HPI  Donald Hodges is a 65 year old Caucasian male   with a history of heavy alcoholism who had prostate cancer and underwent   a radical prostatectomy at [facility] in February.  He had been   doing well until mid March 2012, when he was admitted on January 09, 2011,   with lower abdominal pain and serous physiology white count of 30,000   and found to have a large intraabdominal abscess on CT scan.  He was   actually seen initially at [facility] and then   transferred to [facility].  CT scan had shown a large   intraabdominal abscess.  This was drained under CT guidance and drain   was placed.  He was placed initially on vancomycin and Zosyn.  Initial   drainage was on January 09, 2011.  200 mL of fluid was removed at initial   drain.  He was treated with antibiotics at [facility] and had serial CT   scans including one I performed on January 15, 2011, which showed   resolution in the size of an abscess in the retroperitoneum in the right   side of low pelvis where the drainage catheter was in place.  There was   a small stable fluid collection in the left pelvic sidewall on the   obturator region measuring 2.5 x 2.2 cm in dimensions as well as a small   subcutaneous abscess on the right side of the low pelvis overlying the   right inferior rectus muscle, decrease in size from prior exam and now   at 2.1 x 3.7 that was previously 2.3 x 3.9 cm.  He was continued on   antibiotics, but then developed acute renal failure with creatinine   continuing to rise.  He was taken off vancomycin and ultimately   transferred to [facility] for further care.  His purulent fluid   that is collecting in his drainage tube has been repeatedly sampled over   at [facility] and has been growing MRSA on every culture that has been   collected including one as recent as January 18, 2011. I signed it as is common and we changed him over to daptomycin. He received vancomycin every 48 hours until last week. The PICC line was for some reason not discontinued at that point time for unclear reasons to me. Patient feels that his abdominal wall abscess has largely resolved he has less pain there. We would've liked to have a repeat CT scan though I would wonder proceed with this. The patient also did have his PICC line removed that was done during his visit today. We spent more than 45 minutes including greater than 50% of time spent coordinating care including organizing the CT scan, removing PIC line and in counseling the patient face-to-face     PAST MEDICAL HISTORY:   1. Heavy alcoholism.   2. Prostate cancer, status post prostatectomy.   3. Renal failure, thought to be related to vancomycin toxicity.   4. Hyponatremia.   5. Diabetes mellitus,.   6. History of polysubstance abuse.        Review of Systems  Constitutional: Negative for chills, diaphoresis, activity change and appetite change.  HENT: Negative for congestion and rhinorrhea.   Respiratory: Negative for apnea, cough, choking and chest tightness.   Cardiovascular: Negative for chest pain, palpitations and  leg swelling.  Gastrointestinal: Negative for nausea, vomiting, abdominal pain, diarrhea, constipation, blood in stool, abdominal distention and anal bleeding.  Genitourinary: Negative for dysuria and hematuria.  Neurological: Negative for dizziness, tremors, facial asymmetry, weakness and headaches.  Hematological: Negative for adenopathy. Does not bruise/bleed easily.  Psychiatric/Behavioral: Negative for behavioral problems, confusion, decreased concentration and agitation.       Objective:   Physical Exam  Constitutional: He is oriented to person, place, and time. He appears well-developed. No distress.  HENT:  Head: Normocephalic and atraumatic.  Mouth/Throat: Oropharynx is clear and moist. No  oropharyngeal exudate.  Eyes: Conjunctivae and EOM are normal. Pupils are equal, round, and reactive to light. No scleral icterus.  Neck: Normal range of motion. Neck supple.  Cardiovascular: Normal rate, regular rhythm and normal heart sounds.  Exam reveals no gallop and no friction rub.   No murmur heard. Pulmonary/Chest: Effort normal and breath sounds normal. No respiratory distress. He has no wheezes. He has no rales. He exhibits no tenderness.  Abdominal: He exhibits no distension. There is tenderness. There is no rebound and no guarding.       On examination the patient's abdomen and a firm mass palpable deeper in the abdomen with some tenderness about this.  Musculoskeletal: He exhibits no edema and no tenderness.  Lymphadenopathy:    He has no cervical adenopathy.  Neurological: He is alert and oriented to person, place, and time. Coordination normal.  Skin: Skin is warm. No rash noted. He is not diaphoretic. No erythema. No pallor.  Psychiatric: He has a normal mood and affect. His behavior is normal. Thought content normal.          Assessment & Plan:  Abscess We will repeat a CT scan of the abdomen and pelvis without intravenous contrast reevaluate his MRSA abscesses. In the interim will change him over to doxycycline and remove his PICC line. We'll have the patient followup in one month's time. If abscesses have resolved we can then start antibiotics altogether. I expect will be some abnormalities on CT scan.  MRSA (methicillin resistant staph aureus) culture positive Start doxycycline.  Prostate cancer Status post prostatectomy as described above.  Renal failure, acute Is likely multifactorial but made included vancomycin as a possible culprit for his acute nephrotoxicity. Will avoid vancomycin the future.

## 2011-03-02 NOTE — Assessment & Plan Note (Signed)
Start doxycycline 

## 2011-03-03 NOTE — Consult Note (Signed)
NAMEASIAH, BEFORT               ACCOUNT NO.:  [id number]  MEDICAL RECORD NO.:  [id number]           PATIENT TYPE:  I  LOCATION:  6738                         FACILITY:  [facility]  PHYSICIAN:  Terrial Rhodes, M.D.DATE OF BIRTH:  1946/06/07  DATE OF CONSULTATION:  01/21/2011 DATE OF DISCHARGE:                                CONSULTATION   REASON FOR CONSULTATION:  Acute renal failure.  HISTORY OF PRESENT ILLNESS:  Mr. Hallam is a 65 year old white male with multiple medical problems most notable for diabetes, hypertension, alcoholism, and prostate cancer status post radical prostatectomy on December 03, 2010.  His postoperative course was complicated by intraabdominal abscess seen on CT scan on January 09, 2011.  The patient started developing increasing abdominal pain over the week preceding his presentation on 24th and outpatient CT scan showed an abscess.  He was admitted to [facility] for further evaluation and management.  He had a CT-guided JP drain placed.  Culture was significant for methicillin-resistant Staph aureus and started on vancomycin.  He had also been on Levaquin and had been followed for this.  However, his course continued to deteriorate and his creatinine began climbing on January 14, 2011, and he was subsequently transferred to [facility] on January 20, 2011, due to his progressive acute kidney injury and hyperkalemia.  His trending creatinine is as follows:  On January 09, 2011, creatinine was 0.62; on January 10, 2011, 0.72; on January 11, 2011, creatinine was 0.71; on January 12, 2011, creatinine 0.73; January 13, 2011, creatinine 0.7; on January 14, 2011, creatinine 1.07; on January 15, 2011, creatinine 1.37; on January 17, 2011, creatinine 2.77; on January 18, 2011, 4.26; on January 19, 2011, 5.14.  Of note, his vancomycin level was 26 and he also presented with hyponatremia with a serum sodium of 113 at the time of admission.  The patient overall feels better.  He  has had some increasing urine output.  However, he was transferred to [facility] for further evaluation and management of his progressive kidney disease.  ALLERGIES:  He has no known drug allergies.  PAST MEDICAL HISTORY: 1. Prostate cancer.     a.     Status post prostatectomy on December 03, 2010. 2. Intraabdominal MRSA abscess postprostatectomy. 3. Alcohol abuse with history of DTs. 4. Hyponatremia. 5. Hypertension. 6. Diabetes mellitus. 7. Hypercholesterolemia. 8. History of polysubstance abuse.  OUTPATIENT MEDICATIONS ON TRANSFER: 1. Lovastatin 20 mg a day. 2. Metformin 1 g b.i.d. 3. Fluconazole 150 mg daily. 4. Levaquin 500 mg daily. 5. Ventolin HFA 2 puffs 4 times a day. 6. IV vancomycin.  CURRENT MEDICATIONS: 1. Amlodipine 5 mg a day. 2. Clonidine 0.1 mg q.8 h. 3. Lotrimin cream b.i.d. 4. Insulin sliding scale. 5. Thiamine 100 mg daily. 6. Albuterol nebulizers p.r.n. 7. Atropine nebulizers p.r.n. 8. Ativan 1 mg every 4 hours. 9. Zofran p.r.n.  FAMILY HISTORY:  Noncontributory.  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  He has a 75-pack-year tobacco history, quit smoking in February 2012 when he went in surgery, but continues to drink 8-12 beers a day. He has a history of polysubstance abuse including cocaine, crack, and marijuana.  No IV drug use.  REVIEW OF SYSTEMS:  GENERAL:  He had some anorexia and nausea, but this is improved.  CARDIAC:  No chest pain, palpitations, orthopnea, or PND. PULMONARY:  No shortness of breath, hemoptysis, or productive cough. GI:  No recent nausea or vomiting.  Still has some abdominal pain.  No constipation, hematochezia, or red blood per rectum.  GU:  No dysuria, pyuria, hematuria, urgency, frequency, or retention.  RHEUMATOLOGIC:  No arthralgias or  myalgias with increasing lower extremity edema. NEUROLOGIC:  No numbness, tingling, weakness, ataxia, or aphasia.  All other systems negative.  PHYSICAL EXAMINATION:  GENERAL:  This is a well-developed, well- nourished man, lying in bed, in no apparent distress. VITAL SIGNS:  Temperature 98.1, pulse 57, blood pressure 140/71, and respiratory rate 15. HEENT:  Head is normocephalic and atraumatic.  Extraocular movements are intact.  No icterus.  Oropharynx without lesions. NECK:  Supple.  No lymphadenopathy or bruits. LUNGS:  Clear to auscultation and percussion bilaterally.  No rales or rhonchi. CARDIAC:  Regular rate and rhythm.  No precordial rub appreciated. ABDOMEN:  Normoactive bowel sounds.  Soft.  He has some abdominal wall edema in the suprapubic region.  He also has a firm, tender mass in his right lower quadrant that is not fluctuant, but firm and tender.  No guarding or rebound. EXTREMITIES:  1+ pitting edema in bilateral lower extremities, 1+ pedal pulses.  LABORATORY DATA:  Sodium 133, potassium 5.3, chloride 107, CO2 of 19, BUN 38, creatinine 6.75, glucose 88, calcium 7.1, albumin 2.1, INR 1.3. White blood cell count 5.7, hemoglobin 9.3, and platelets 191.  LFTs were normal.  ASSESSMENT/PLAN: 1. Acute renal failure, likely multifactorial including contrast-     induced nephropathy, vancomycin toxicity, and ischemic acute     tubular necrosis given his relative hypotension on admission.  Also     differential would be acute interstitial nephritis given his     multiple antibiotics as well as postinfectious glomerulonephritis     or obstruction.  In the meantime, we will order urine studies,     abdominal ultrasound to rule out obstruction as well as an ASO     titer and complement levels.  He has had increasing urine output     over the last 24 hours.  We will continue to follow his urine     output and his creatinine.  He is not uremic and no indication for      emergent dialysis at this time. 2. Hyperkalemia secondary to acute renal failure.  He was also     receiving potassium supplements at [place].  These have been     discontinued.  He will be treated with Kayexalate and Lasix and     follow his potassium. 3. Hyponatremia is improved since his hospitalization, likely related     to malnutrition, alcohol abuse, cirrhosis, possible beer potomania,     however, this has since improved.  We will continue with diuretic     therapy given his volume overload. 4. Pelvic abscess.  We will hold vancomycin and Zyvox right now.  We     would recommend ID consultation for further management. We will     also obtain an abdominal ultrasound to evaluate this abscesses as  it may need another drain if it is reaccumulated and possible     surgery consult. 5. Acute blood loss anemia:  Follow hematocrit and hemoglobin and     transfuse as needed. 6. Hypertension, on meds. 7. Alcohol abuse, on withdrawal precautions. 8. Cirrhosis.  Follow INR and the liver function tests. 9. Diabetes mellitus, per Primary Service.  Thank you for this consult.          ______________________________ Terrial Rhodes, M.D.     JC/MEDQ  D:  01/21/2011  T:  01/22/2011  Job:  [id number]  Electronically Signed by Terrial Rhodes M.D. on 03/03/2011 08:06:25 AM

## 2011-03-10 ENCOUNTER — Other Ambulatory Visit (HOSPITAL_COMMUNITY): Payer: Medicare Other

## 2011-03-11 ENCOUNTER — Telehealth: Payer: Self-pay | Admitting: *Deleted

## 2011-03-11 NOTE — Telephone Encounter (Signed)
Patient calling for CT results.  It was done 03/10/11 in [place].  They have been instructed to fax [place] results.  Patient will call back on Monday and will get another doctor to look at results due to Dr. Daiva Eves being on vacation. Wendall Mola CMA

## 2011-03-17 ENCOUNTER — Encounter: Payer: Self-pay | Admitting: Infectious Disease

## 2011-09-14 IMAGING — CT CT ABD-PELV W/O CM
2 of 5 series · 16 of 46 positions shown, 18 images · non-contrast
Comparison: Ultrasound dated 01/22/2011

CLINICAL DATA: Abdominal pain.  Renal failure.

CT ABDOMEN AND PELVIS WITHOUT CONTRAST
TECHNIQUE: Multidetector CT imaging of the abdomen and pelvis was
performed following the standard protocol without intravenous
contrast.

[Series 2: abd/pelv w/o 5.0 b31f st · axial · non-contrast · 0.92mm/px · z∈[-580,-135]mm · 13 of 99 slices shown, 15 images]
[im 5/99  soft-tissue]
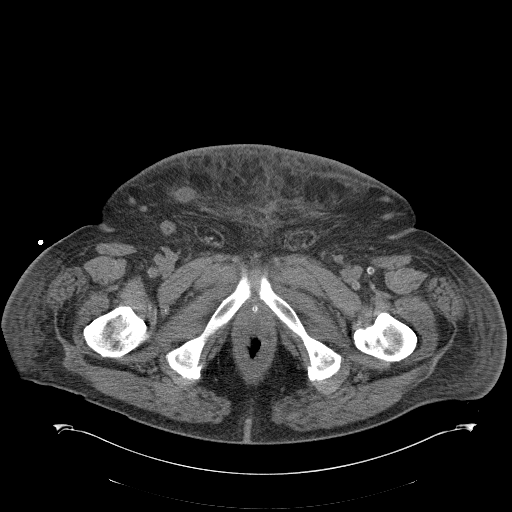
[im 5/99  bone]
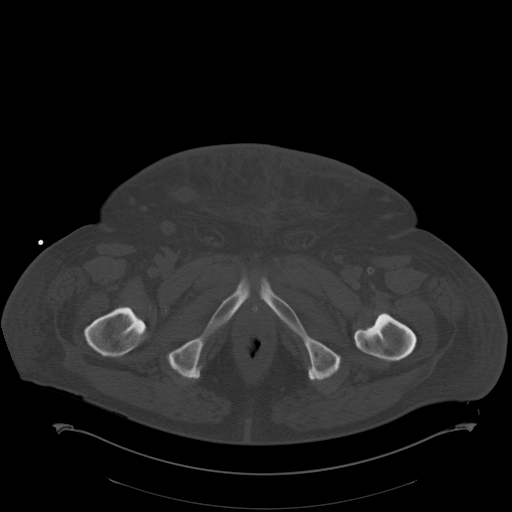
[im 13/99  soft-tissue]
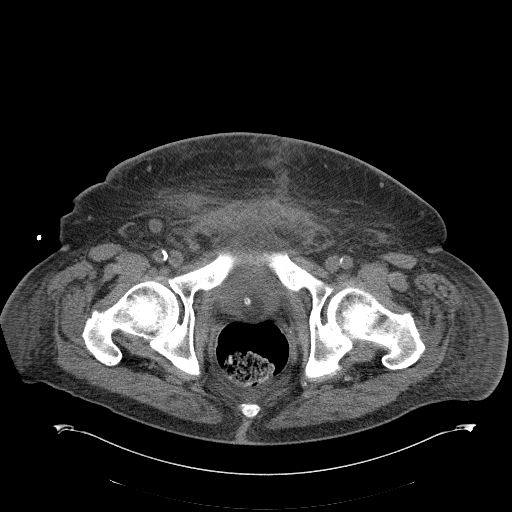
[im 22/99  soft-tissue]
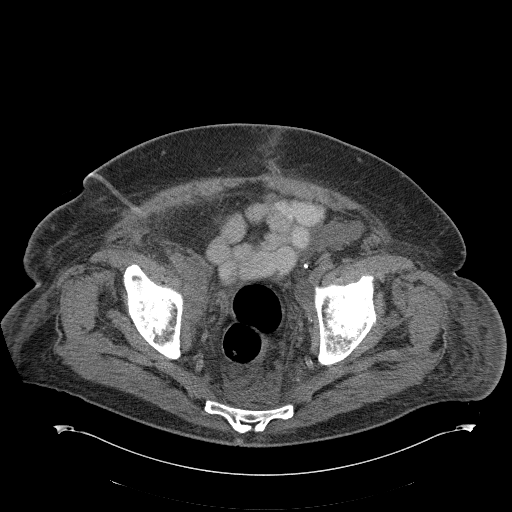
[im 26/99  soft-tissue]
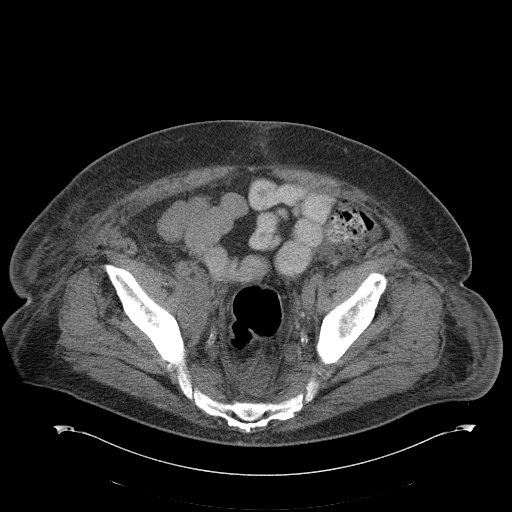
[im 35/99  soft-tissue]
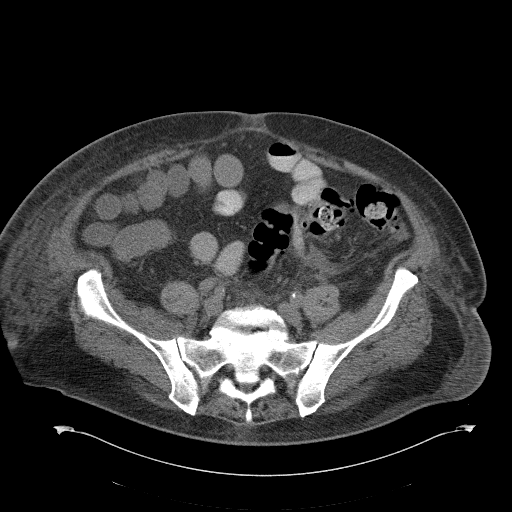
[im 43/99  soft-tissue]
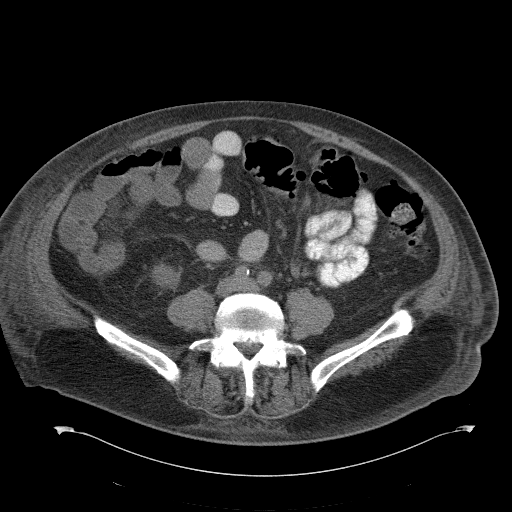
[im 52/99  soft-tissue]
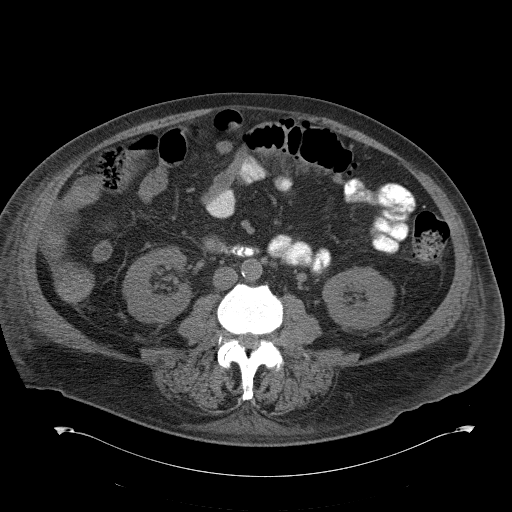
[im 56/99  soft-tissue]
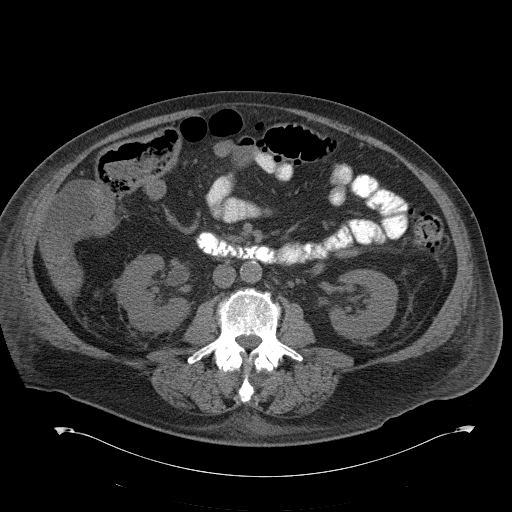
[im 64/99  soft-tissue]
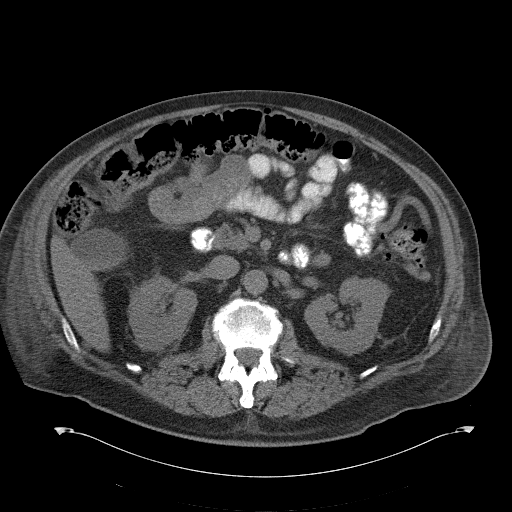
[im 64/99  bone]
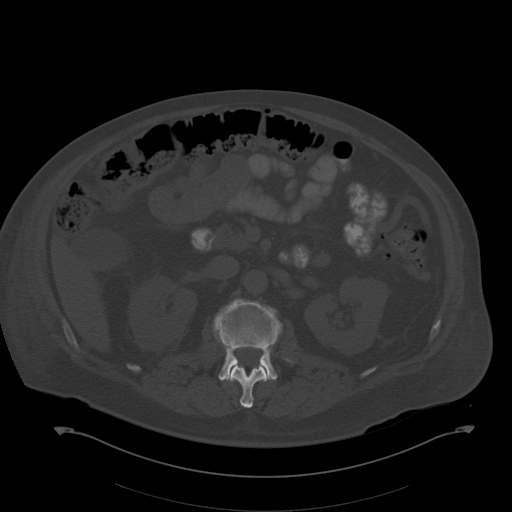
[im 73/99  soft-tissue]
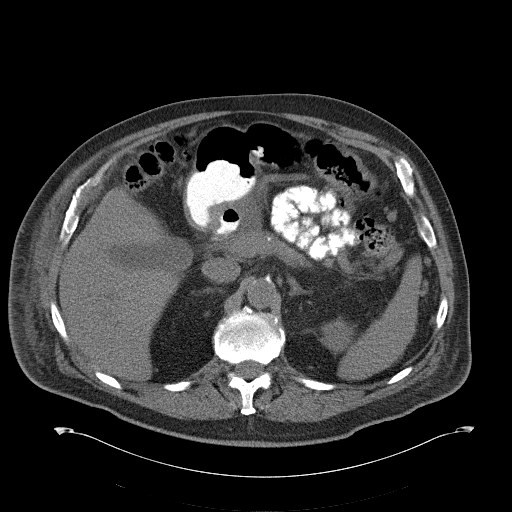
[im 77/99  soft-tissue]
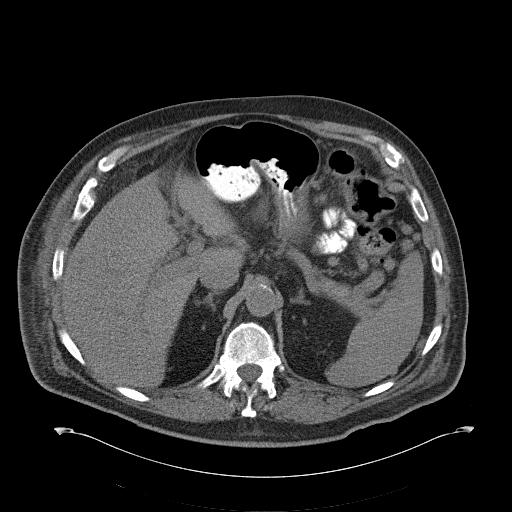
[im 86/99  soft-tissue]
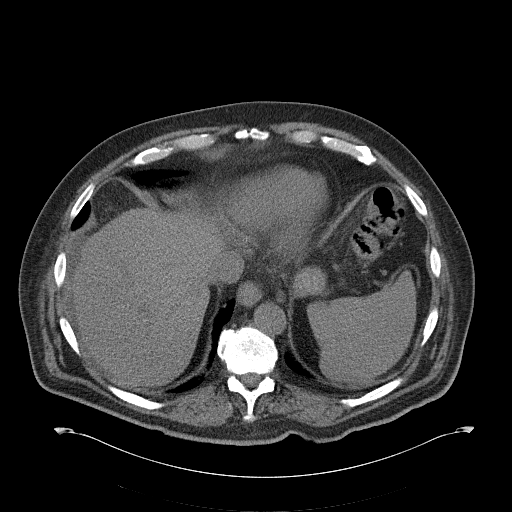
[im 94/99  soft-tissue]
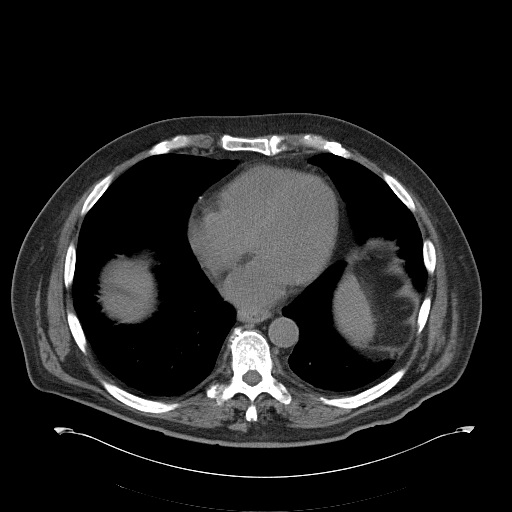

[Series 602: coronal · coronal · 0.97mm/px · 3 of 89 slices shown]
[im 30/89  soft-tissue]
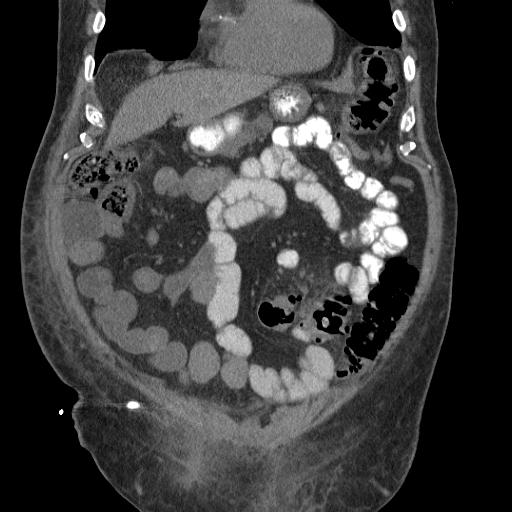
[im 40/89  soft-tissue]
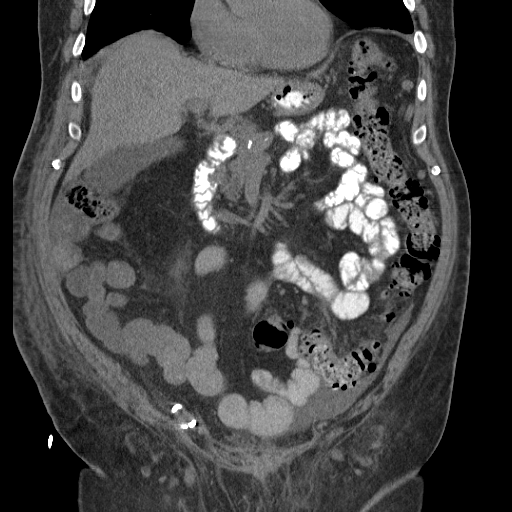
[im 49/89  soft-tissue]
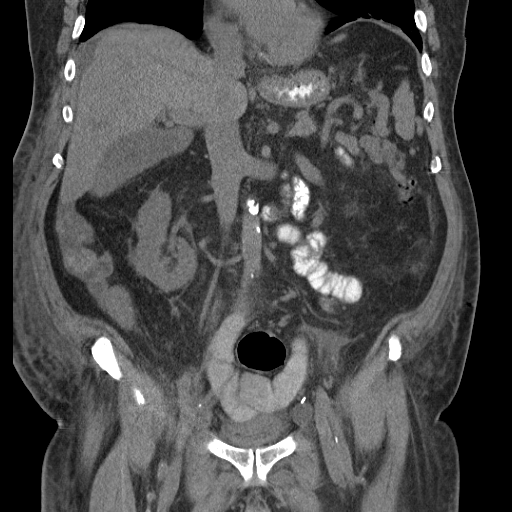

[16 of 46 positions shown; findings below may reference images not displayed]

FINDINGS: There is a pigtail drainage catheter in the right lower
quadrant.  There is no fluid or inflammation around the pigtail
catheter.

The patient does have a focal 5.5 x 4.0 x 3.6 cm fluid collection
which is well defined in the right mid abdomen just below the right
lobe of the liver and just below the gallbladder adjacent to the
hepatic flexure of the colon.  In addition, there is a less well-
defined fluid collection in the left lower quadrant adjacent to the
descending colon extending into the pelvis along the left iliac
vessels.  This fluid collection is approximately 9 x 2 x 2 cm.

There is also a small amount of fluid in the lesser sac adjacent to
the gastric antrum.

There are numerous varices in the left upper quadrant of the
abdomen.  The liver and spleen are not enlarged.  There is slight
lobulation of the liver.  Does the patient have a history of
ascites?

There is diffuse pancreatic atrophy with scattered calcifications
consistent with chronic calcific pancreatitis.

The adrenal glands and kidneys appear normal.

The patient has extensive diverticulosis of the colon primarily
involving the left side.

No acute osseous abnormalities.  No free air.  There is slight
presacral edema.  There is also anasarca.
IMPRESSION: 1.  There is no evidence of an abscess around the drainage catheter
in the right lower quadrant.  There is no residual fluid around the
tip of the catheter.
2.  Fluid collections in the lesser sac, in the right mid abdomen,
and in the left lower quadrant as described.  These may represent
loculated ascites but I cannot exclude abscesses on this
noncontrasted study.
3.  Extensive diverticulosis of the colon.
4.  Varices in the left upper quadrant.
5.  Slight irregularity of the liver parenchyma which might
represent cirrhosis.
6.  Anasarca.
7.  Chronic calcific pancreatitis.

## 2012-07-08 DIAGNOSIS — M79609 Pain in unspecified limb: Secondary | ICD-10-CM | POA: Insufficient documentation

## 2012-07-08 DIAGNOSIS — M545 Low back pain, unspecified: Secondary | ICD-10-CM | POA: Insufficient documentation

## 2012-07-08 DIAGNOSIS — R32 Unspecified urinary incontinence: Secondary | ICD-10-CM | POA: Insufficient documentation

## 2012-07-08 DIAGNOSIS — F1011 Alcohol abuse, in remission: Secondary | ICD-10-CM | POA: Insufficient documentation

## 2012-07-08 DIAGNOSIS — R531 Weakness: Secondary | ICD-10-CM | POA: Insufficient documentation

## 2012-07-08 DIAGNOSIS — E559 Vitamin D deficiency, unspecified: Secondary | ICD-10-CM | POA: Insufficient documentation

## 2012-07-08 DIAGNOSIS — E119 Type 2 diabetes mellitus without complications: Secondary | ICD-10-CM | POA: Insufficient documentation

## 2012-07-08 DIAGNOSIS — Z8546 Personal history of malignant neoplasm of prostate: Secondary | ICD-10-CM | POA: Insufficient documentation

## 2012-07-08 DIAGNOSIS — R609 Edema, unspecified: Secondary | ICD-10-CM | POA: Insufficient documentation

## 2013-05-28 DIAGNOSIS — M48061 Spinal stenosis, lumbar region without neurogenic claudication: Secondary | ICD-10-CM | POA: Insufficient documentation

## 2013-05-28 DIAGNOSIS — F329 Major depressive disorder, single episode, unspecified: Secondary | ICD-10-CM | POA: Insufficient documentation

## 2013-05-28 DIAGNOSIS — G40909 Epilepsy, unspecified, not intractable, without status epilepticus: Secondary | ICD-10-CM | POA: Insufficient documentation

## 2015-03-05 ENCOUNTER — Encounter
Admission: RE | Admit: 2015-03-05 | Discharge: 2015-03-05 | Disposition: A | Discharge status: Home / Self Care | Payer: Medicare Other | Source: Ambulatory Visit | Attending: Ophthalmology | Admitting: Ophthalmology

## 2015-03-05 DIAGNOSIS — Z01810 Encounter for preprocedural cardiovascular examination: Secondary | ICD-10-CM | POA: Diagnosis present

## 2015-03-05 DIAGNOSIS — Z01812 Encounter for preprocedural laboratory examination: Secondary | ICD-10-CM | POA: Diagnosis not present

## 2015-03-05 DIAGNOSIS — H2512 Age-related nuclear cataract, left eye: Secondary | ICD-10-CM | POA: Insufficient documentation

## 2015-03-05 DIAGNOSIS — D649 Anemia, unspecified: Secondary | ICD-10-CM | POA: Diagnosis not present

## 2015-03-05 LAB — HEMOGLOBIN: Hemoglobin: 12.2 g/dL — ABNORMAL LOW (ref 13.0–18.0)

## 2015-03-06 ENCOUNTER — Encounter: Payer: Self-pay | Admitting: *Deleted

## 2015-03-11 ENCOUNTER — Ambulatory Visit: Payer: Medicare Other | Admitting: Anesthesiology

## 2015-03-11 ENCOUNTER — Ambulatory Visit
Admission: RE | Admit: 2015-03-11 | Discharge: 2015-03-11 | Disposition: A | Discharge status: Home / Self Care | Payer: Medicare Other | Source: Ambulatory Visit | Attending: Ophthalmology | Admitting: Ophthalmology

## 2015-03-11 ENCOUNTER — Encounter: Payer: Self-pay | Admitting: *Deleted

## 2015-03-11 ENCOUNTER — Encounter
Admission: RE | Disposition: A | Discharge status: Home / Self Care | Payer: Self-pay | Source: Ambulatory Visit | Attending: Ophthalmology

## 2015-03-11 DIAGNOSIS — Z79899 Other long term (current) drug therapy: Secondary | ICD-10-CM | POA: Diagnosis not present

## 2015-03-11 DIAGNOSIS — Z7982 Long term (current) use of aspirin: Secondary | ICD-10-CM | POA: Insufficient documentation

## 2015-03-11 DIAGNOSIS — M5430 Sciatica, unspecified side: Secondary | ICD-10-CM | POA: Insufficient documentation

## 2015-03-11 DIAGNOSIS — H2512 Age-related nuclear cataract, left eye: Secondary | ICD-10-CM | POA: Insufficient documentation

## 2015-03-11 DIAGNOSIS — M25519 Pain in unspecified shoulder: Secondary | ICD-10-CM | POA: Diagnosis not present

## 2015-03-11 DIAGNOSIS — J449 Chronic obstructive pulmonary disease, unspecified: Secondary | ICD-10-CM | POA: Diagnosis not present

## 2015-03-11 DIAGNOSIS — E119 Type 2 diabetes mellitus without complications: Secondary | ICD-10-CM | POA: Insufficient documentation

## 2015-03-11 DIAGNOSIS — K279 Peptic ulcer, site unspecified, unspecified as acute or chronic, without hemorrhage or perforation: Secondary | ICD-10-CM | POA: Insufficient documentation

## 2015-03-11 DIAGNOSIS — C61 Malignant neoplasm of prostate: Secondary | ICD-10-CM | POA: Diagnosis not present

## 2015-03-11 DIAGNOSIS — I499 Cardiac arrhythmia, unspecified: Secondary | ICD-10-CM | POA: Insufficient documentation

## 2015-03-11 HISTORY — DX: Personal history of other medical treatment: Z92.89

## 2015-03-11 HISTORY — DX: Allergy status to unspecified drugs, medicaments and biological substances: Z88.9

## 2015-03-11 HISTORY — DX: Pneumonia, unspecified organism: J18.9

## 2015-03-11 HISTORY — PX: CATARACT EXTRACTION W/PHACO: SHX586

## 2015-03-11 HISTORY — DX: Sciatica, unspecified side: M54.30

## 2015-03-11 HISTORY — DX: Pain in unspecified shoulder: M25.519

## 2015-03-11 HISTORY — DX: Cardiac arrhythmia, unspecified: I49.9

## 2015-03-11 HISTORY — DX: Malignant neoplasm of prostate: C61

## 2015-03-11 HISTORY — DX: Type 2 diabetes mellitus without complications: E11.9

## 2015-03-11 HISTORY — DX: Unspecified asthma, uncomplicated: J45.909

## 2015-03-11 HISTORY — DX: Chronic obstructive pulmonary disease, unspecified: J44.9

## 2015-03-11 LAB — GLUCOSE, CAPILLARY: Glucose-Capillary: 131 mg/dL — ABNORMAL HIGH (ref 65–99)

## 2015-03-11 SURGERY — PHACOEMULSIFICATION, CATARACT, WITH IOL INSERTION
Anesthesia: Monitor Anesthesia Care | Site: Eye | Laterality: Left | Wound class: Clean

## 2015-03-11 MED ORDER — POVIDONE-IODINE 5 % OP SOLN
OPHTHALMIC | Status: AC
  Administered 2015-03-11: 1 via OPHTHALMIC
  Filled 2015-03-11: qty 30

## 2015-03-11 MED ORDER — TETRACAINE HCL 0.5 % OP SOLN
OPHTHALMIC | Status: AC
  Administered 2015-03-11: 1 [drp] via OPHTHALMIC
  Filled 2015-03-11: qty 2

## 2015-03-11 MED ORDER — MOXIFLOXACIN HCL 0.5 % OP SOLN - NO CHARGE
OPHTHALMIC | Status: DC | PRN
  Administered 2015-03-11: 1 [drp] via OPHTHALMIC

## 2015-03-11 MED ORDER — ARMC OPHTHALMIC DILATING GEL
OPHTHALMIC | Status: AC
  Administered 2015-03-11: 1 via OPHTHALMIC
  Filled 2015-03-11: qty 0.25

## 2015-03-11 MED ORDER — ALFENTANIL 500 MCG/ML IJ INJ
INJECTION | INTRAMUSCULAR | Status: DC | PRN
  Administered 2015-03-11: 500 ug via INTRAVENOUS

## 2015-03-11 MED ORDER — EPINEPHRINE HCL 1 MG/ML IJ SOLN
INTRAOCULAR | Status: DC | PRN
  Administered 2015-03-11: 200 mL

## 2015-03-11 MED ORDER — MIDAZOLAM HCL 5 MG/5ML IJ SOLN
INTRAMUSCULAR | Status: DC | PRN
  Administered 2015-03-11: 2 mg via INTRAVENOUS

## 2015-03-11 MED ORDER — TETRACAINE HCL 0.5 % OP SOLN
1.0000 [drp] | OPHTHALMIC | Status: AC | PRN
  Administered 2015-03-11: 1 [drp] via OPHTHALMIC

## 2015-03-11 MED ORDER — ARMC OPHTHALMIC DILATING GEL
1.0000 "application " | OPHTHALMIC | Status: AC | PRN
  Administered 2015-03-11: 1 via OPHTHALMIC

## 2015-03-11 MED ORDER — CEFUROXIME OPHTHALMIC INJECTION 1 MG/0.1 ML
INJECTION | OPHTHALMIC | Status: AC
  Filled 2015-03-11: qty 0.1

## 2015-03-11 MED ORDER — MOXIFLOXACIN HCL 0.5 % OP SOLN
OPHTHALMIC | Status: AC
  Filled 2015-03-11: qty 3

## 2015-03-11 MED ORDER — EPINEPHRINE HCL 1 MG/ML IJ SOLN
INTRAMUSCULAR | Status: AC
  Filled 2015-03-11: qty 1

## 2015-03-11 MED ORDER — CEFUROXIME OPHTHALMIC INJECTION 1 MG/0.1 ML
INJECTION | OPHTHALMIC | Status: DC | PRN
  Administered 2015-03-11: 0.1 mL via INTRACAMERAL

## 2015-03-11 MED ORDER — NA CHONDROIT SULF-NA HYALURON 40-17 MG/ML IO SOLN
INTRAOCULAR | Status: AC
  Filled 2015-03-11: qty 1

## 2015-03-11 MED ORDER — CARBACHOL 0.01 % IO SOLN
INTRAOCULAR | Status: DC | PRN
  Administered 2015-03-11: 0.5 mL via INTRAOCULAR

## 2015-03-11 MED ORDER — POVIDONE-IODINE 5 % OP SOLN
1.0000 "application " | Freq: Once | OPHTHALMIC | Status: AC
  Administered 2015-03-11: 1 via OPHTHALMIC

## 2015-03-11 MED ORDER — SODIUM CHLORIDE 0.9 % IV SOLN
INTRAVENOUS | Status: DC
  Administered 2015-03-11 (×2): via INTRAVENOUS

## 2015-03-11 SURGICAL SUPPLY — 22 items
ACTIVE FMS ×3 IMPLANT
CANNULA ANT/CHMB 27GA (MISCELLANEOUS) ×3 IMPLANT
GLOVE BIO SURGEON STRL SZ8 (GLOVE) ×3 IMPLANT
GLOVE BIOGEL M 6.5 STRL (GLOVE) ×3 IMPLANT
GLOVE SURG LX 8.0 MICRO (GLOVE) ×2
GLOVE SURG LX STRL 8.0 MICRO (GLOVE) ×1 IMPLANT
GOWN STRL REUS W/ TWL LRG LVL3 (GOWN DISPOSABLE) ×2 IMPLANT
GOWN STRL REUS W/TWL LRG LVL3 (GOWN DISPOSABLE) ×4
LENS IOL TECNIS 19.5 (Intraocular Lens) ×3 IMPLANT
LENS IOL TECNIS MONO 1P 19.5 (Intraocular Lens) ×1 IMPLANT
PACK CATARACT (MISCELLANEOUS) ×3 IMPLANT
PACK CATARACT BRASINGTON LX (MISCELLANEOUS) ×3 IMPLANT
PACK EYE AFTER SURG (MISCELLANEOUS) ×3 IMPLANT
SOL BSS BAG (MISCELLANEOUS) ×3
SOL PREP PVP 2OZ (MISCELLANEOUS) ×3
SOLUTION BSS BAG (MISCELLANEOUS) ×1 IMPLANT
SOLUTION PREP PVP 2OZ (MISCELLANEOUS) ×1 IMPLANT
SYR 5ML LL (SYRINGE) ×3 IMPLANT
SYR TB 1ML 27GX1/2 LL (SYRINGE) ×3 IMPLANT
WATER STERILE IRR 1000ML POUR (IV SOLUTION) ×3 IMPLANT
WIPE NON LINTING 3.25X3.25 (MISCELLANEOUS) ×3 IMPLANT
ZCB0019.5 TECNIS LENS ×3 IMPLANT

## 2015-03-11 NOTE — Transfer of Care (Signed)
Immediate Anesthesia Transfer of Care Note  Patient: Donald Hodges  Procedure(s) Performed: Procedure(s) with comments: CATARACT EXTRACTION PHACO AND INTRAOCULAR LENS PLACEMENT (IOC) (Left) - [place] 00:48 AP% 21.6 CDE 10.47  Patient Location: Short Stay  Anesthesia Type:MAC  Level of Consciousness: awake, alert , oriented and patient cooperative  Airway & Oxygen Therapy: Patient Spontanous Breathing  Post-op Assessment: Report given to RN, Post -op Vital signs reviewed and stable and Patient moving all extremities X 4  Post vital signs: Reviewed and stable  Last Vitals:  Filed Vitals:   03/11/15 1107  BP: 141/75  Pulse: 48  Temp: 36.2 C  Resp: 16    Complications: No apparent anesthesia complications

## 2015-03-11 NOTE — Anesthesia Postprocedure Evaluation (Signed)
  Anesthesia Post-op Note  Patient: Donald Hodges  Procedure(s) Performed: Procedure(s) with comments: CATARACT EXTRACTION PHACO AND INTRAOCULAR LENS PLACEMENT (IOC) (Left) - [place] 00:48 AP% 21.6 CDE 10.47  Anesthesia type:MAC  Patient location: PACU  Post pain: Pain level controlled  Post assessment: Post-op Vital signs reviewed, Patient's Cardiovascular Status Stable, Respiratory Function Stable, Patent Airway and No signs of Nausea or vomiting  Post vital signs: Reviewed and stable  Last Vitals:  Filed Vitals:   03/11/15 1107  BP: 141/75  Pulse: 48  Temp: 36.2 C  Resp: 16    Level of consciousness: awake, alert  and patient cooperative  Complications: No apparent anesthesia complications

## 2015-03-11 NOTE — H&P (Signed)
  All labs reviewed. Abnormal studies sent to patients PCP when indicated.  Previous H&P reviewed, patient examined, there are NO CHANGES.  Donald Hodges LOUIS5/24/201610:37 AM

## 2015-03-11 NOTE — Discharge Instructions (Signed)
See cataract post op handout  Eye Surgery Discharge Instructions  Expect mild scratchy sensation or mild soreness. DO NOT RUB YOUR EYE!  The day of surgery:  Minimal physical activity, but bed rest is not required  No reading, computer work, or close hand work  No bending, lifting, or straining.  May watch TV  For 24 hours:  No driving, legal decisions, or alcoholic beverages  Safety precautions  Eat anything you prefer: It is better to start with liquids, then soup then solid foods.  _____ Eye patch should be worn until postoperative exam tomorrow.  ____ Solar shield eyeglasses should be worn for comfort in the sunlight/patch while sleeping  Resume all regular medications including aspirin or Coumadin if these were discontinued prior to surgery. You may shower, bathe, shave, or wash your hair. Tylenol may be taken for mild discomfort.  Call your doctor if you experience significant pain, nausea, or vomiting, fever > 101 or other signs of infection. [phone] or [phone] Specific instructions:  Follow-up Information    Follow up with Tim Lair, MD On 03/12/2015.   Specialty:  Ophthalmology   Why:  10:40   Contact information:   [address] [place] [address] [phone]

## 2015-03-11 NOTE — Anesthesia Preprocedure Evaluation (Signed)
Anesthesia Evaluation  Patient identified by MRN, date of birth, ID band Patient awake    Reviewed: Allergy & Precautions, H&P , NPO status , Patient's Chart, lab work & pertinent test results, reviewed documented beta blocker date and time   Airway Mallampati: II  TM Distance: >3 FB Neck ROM: full    Dental no notable dental hx. (+) Teeth Intact   Pulmonary neg pulmonary ROS, asthma , pneumonia -, COPDformer smoker,  breath sounds clear to auscultation  Pulmonary exam normal       Cardiovascular Exercise Tolerance: Good hypertension, negative cardio ROS  + dysrhythmias Rhythm:regular Rate:Normal     Neuro/Psych PSYCHIATRIC DISORDERS  Neuromuscular disease negative neurological ROS  negative psych ROS   GI/Hepatic negative GI ROS, Neg liver ROS,   Endo/Other  negative endocrine ROSdiabetes  Renal/GU Renal disease     Musculoskeletal   Abdominal   Peds  Hematology negative hematology ROS (+)   Anesthesia Other Findings   Reproductive/Obstetrics negative OB ROS                             Anesthesia Physical Anesthesia Plan  ASA: III  Anesthesia Plan: MAC   Post-op Pain Management:    Induction:   Airway Management Planned:   Additional Equipment:   Intra-op Plan:   Post-operative Plan:   Informed Consent: I have reviewed the patients History and Physical, chart, labs and discussed the procedure including the risks, benefits and alternatives for the proposed anesthesia with the patient or authorized representative who has indicated his/her understanding and acceptance.     Plan Discussed with:   Anesthesia Plan Comments:         Anesthesia Quick Evaluation

## 2015-03-11 NOTE — Op Note (Signed)
PREOPERATIVE DIAGNOSIS:  Nuclear sclerotic cataract of the left eye.   POSTOPERATIVE DIAGNOSIS:  same   OPERATIVE PROCEDURE:  Procedure(s): CATARACT EXTRACTION PHACO AND INTRAOCULAR LENS PLACEMENT (IOC)   SURGEON:  Birder Robson, MD.   ANESTHESIA:   Anesthesiologist: Molli Barrows, MD CRNA: Silvana Newness, CRNA  1.      Managed anesthesia care. 2.      Topical tetracaine drops followed by 2% Xylocaine jelly applied in the preoperative holding area.   COMPLICATIONS:  None.   TECHNIQUE:   Stop and chop   DESCRIPTION OF PROCEDURE:  The patient was examined and consented in the preoperative holding area where the aforementioned topical anesthesia was applied to the left eye and then brought back to the Operating Room where the left eye was prepped and draped in the usual sterile ophthalmic fashion and a lid speculum was placed. A paracentesis was created with the side port blade and the anterior chamber was filled with viscoelastic. A near clear corneal incision was performed with the steel keratome. A continuous curvilinear capsulorrhexis was performed with a cystotome followed by the capsulorrhexis forceps. Hydrodissection and hydrodelineation were carried out with BSS on a blunt cannula. The lens was removed in a stop and chop  technique and the remaining cortical material was removed with the irrigation-aspiration handpiece. The capsular bag was inflated with viscoelastic and the Technis ZCB00 lens was placed in the capsular bag without complication. The remaining viscoelastic was removed from the eye with the irrigation-aspiration handpiece. The wounds were hydrated. The anterior chamber was flushed with Miostat and the eye was inflated to physiologic pressure. 0.1 mL of cefuroxime concentration 10 mg/mL was placed in the anterior chamber. The wounds were found to be water tight. The eye was dressed with Vigamox. The patient was given protective glasses to wear throughout the day and a shield  with which to sleep tonight. The patient was also given drops with which to begin a drop regimen today and will follow-up with me in one day.  Implant Name Type Inv. Item Serial No. Manufacturer Lot No. LRB No. Used  LENS IMPL INTRAOC ZCB00 19.5 - [id number] Intraocular Lens LENS IMPL INTRAOC ZCB00 19.5 [id number] AMO   Left 1   Procedure(s) with comments: CATARACT EXTRACTION PHACO AND INTRAOCULAR LENS PLACEMENT (IOC) (Left) - [place] 00:48 AP% 21.6 CDE 10.47  Electronically signed: [place] 03/11/2015 11:05 AM

## 2015-03-12 ENCOUNTER — Encounter: Payer: Self-pay | Admitting: Ophthalmology

## 2015-12-24 ENCOUNTER — Other Ambulatory Visit: Payer: Self-pay

## 2016-03-10 DIAGNOSIS — D649 Anemia, unspecified: Secondary | ICD-10-CM | POA: Diagnosis not present

## 2016-03-10 DIAGNOSIS — D696 Thrombocytopenia, unspecified: Secondary | ICD-10-CM | POA: Diagnosis not present

## 2018-11-29 ENCOUNTER — Ambulatory Visit (INDEPENDENT_AMBULATORY_CARE_PROVIDER_SITE_OTHER): Payer: Medicare Other

## 2018-11-29 ENCOUNTER — Other Ambulatory Visit: Payer: Self-pay | Admitting: Sports Medicine

## 2018-11-29 ENCOUNTER — Encounter: Payer: Self-pay | Admitting: Sports Medicine

## 2018-11-29 ENCOUNTER — Ambulatory Visit (INDEPENDENT_AMBULATORY_CARE_PROVIDER_SITE_OTHER): Payer: Medicare Other | Admitting: Sports Medicine

## 2018-11-29 ENCOUNTER — Other Ambulatory Visit: Payer: Self-pay

## 2018-11-29 VITALS — BP 155/89 | HR 90 | Resp 16 | Ht 75.0 in | Wt 205.0 lb

## 2018-11-29 DIAGNOSIS — M2041 Other hammer toe(s) (acquired), right foot: Secondary | ICD-10-CM

## 2018-11-29 DIAGNOSIS — M7742 Metatarsalgia, left foot: Secondary | ICD-10-CM

## 2018-11-29 DIAGNOSIS — M2042 Other hammer toe(s) (acquired), left foot: Secondary | ICD-10-CM

## 2018-11-29 DIAGNOSIS — M7741 Metatarsalgia, right foot: Secondary | ICD-10-CM | POA: Diagnosis not present

## 2018-11-29 DIAGNOSIS — L909 Atrophic disorder of skin, unspecified: Secondary | ICD-10-CM

## 2018-11-29 DIAGNOSIS — M21961 Unspecified acquired deformity of right lower leg: Secondary | ICD-10-CM

## 2018-11-29 DIAGNOSIS — M21962 Unspecified acquired deformity of left lower leg: Secondary | ICD-10-CM

## 2018-11-29 DIAGNOSIS — M79672 Pain in left foot: Secondary | ICD-10-CM

## 2018-11-29 DIAGNOSIS — M79671 Pain in right foot: Secondary | ICD-10-CM

## 2018-11-29 DIAGNOSIS — E1100 Type 2 diabetes mellitus with hyperosmolarity without nonketotic hyperglycemic-hyperosmolar coma (NKHHC): Secondary | ICD-10-CM

## 2018-11-29 NOTE — Progress Notes (Signed)
   Subjective:    Patient ID: Donald Hodges, male    DOB: 1946-02-01, 73 y.o.   MRN: [id number]  HPI    Review of Systems  Musculoskeletal: Positive for arthralgias, gait problem, joint swelling and myalgias.  All other systems reviewed and are negative.      Objective:   Physical Exam        Assessment & Plan:

## 2018-11-29 NOTE — Progress Notes (Signed)
Subjective: Donald Hodges is a 73 y.o. male patient with history of diabetes who presents to office today complaining of pain around second toes bilateral.  Reports that the toes are crossing over left greater than right and pain comes to the ball when he is attempting to stand especially when walking barefoot states that he had a left foot injury in 2003 and was told he needed to have surgery but did not have insurance at that time states that he has pain to the ball with pressure and feels like he is stepping on a rock on the left greater than right foot states that he has tried changing shoes with cushions and pads it seems to help a little bit but sometimes when barefoot pain is a sharp pain 7 out of 10 to the ball.  Patient is also interested in diabetic shoes.  Patient reports that his blood sugar this morning was 135 last A1c of 8.1 and last visit to primary care doctor was 1 month ago.  Patient does admit to a history of back problems since age 61.  Patient denies any new cramping, numbness, burning or tingling in the legs.  Review of Systems  Musculoskeletal: Positive for joint pain and myalgias.       Joint swelling  All other systems reviewed and are negative.    Patient Active Problem List   Diagnosis Date Noted  . Clinical depression 05/28/2013  . 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 Lumbar canal stenosis 05/28/2013  . Accumulation of fluid in tissues 07/08/2012  . Alcohol abuse, in remission 07/08/2012  . Absence of bladder continence 07/08/2012  . Avitaminosis D 07/08/2012  . Decreased motor strength 07/08/2012  . Diabetes mellitus, type 2 ([place]) 07/08/2012  . Extremity pain 07/08/2012  . H/O malignant neoplasm of prostate 07/08/2012  . LBP (low back pain) 07/08/2012  . Abscess 03/02/2011  . Renal failure, acute ([place]) 03/02/2011  . Abscess 02/03/2011  . MRSA (methicillin resistant staph aureus) culture positive 02/03/2011  . Alcoholism ([place]) 02/03/2011  . Prostate  cancer ([place]) 02/03/2011   Current Outpatient Medications on File Prior to Visit  Medication Sig Dispense Refill  . AMLODIPINE BESYLATE PO Take 10 mg by mouth.      . cloNIDine (CATAPRES) 0.1 MG tablet Take 0.1 mg by mouth 2 (two) times daily.      . furosemide (LASIX) 80 MG tablet Take 80 mg by mouth 2 (two) times daily.      Marland Kitchen HYDROcodone-acetaminophen (VICODIN) 5-500 MG per tablet Take 1 tablet by mouth every 6 (six) hours as needed.       No current facility-administered medications on file prior to visit.    Allergies  Allergen Reactions  . Vancomycin Other (See Comments)    Renal failure    No results found for this or any previous visit (from the past 2160 hour(s)).  Objective: General: Patient is awake, alert, and oriented x 3 and in no acute distress.  Integument: Skin is warm, dry and supple bilateral.  Nails are short and thickened currently asymptomatic.  Mild reactive callus secondary to fat pad atrophy sub-second metatarsal bilateral.  No signs of infection. No open lesions.  Remaining integument unremarkable.  Vasculature:  Dorsalis Pedis pulse1/4 bilateral. Posterior Tibial pulse  1/4 bilateral.  Capillary fill time <3 sec 1-5 bilateral. Positive hair growth to the level of the digits. Temperature gradient within normal limits. No varicosities present bilateral.  Trace edema to ankles left greater than right.  Neurology: The  patient has intact sensation measured with a 5.07/10g Semmes Weinstein Monofilament at all pedal sites bilateral . Vibratory sensation diminished bilateral with tuning fork. No Babinski sign present bilateral.   Musculoskeletal: Deviated second toes with crossover deformity left greater than right with prominent plantarflexed metatarsal heads bilateral second toe with displacement of fat pad with lesser hammertoe deformity and cavus foot type. Muscular strength 5/5 in all lower extremity muscular groups bilateral without pain on range of motion except  at the ball of foot left greater than right. No tenderness with calf compression bilateral.  X-rays left and right foot reveals evidence of old fracture on left foot, there is significant medial deviation of the second metatarsal phalangeal joint with the second toe crossing over the hallux, left greater than right with lesser hammertoe deformity, increased calcaneal inclination angle supportive of pes cavus foot type, soft tissue margins within normal limits.  No other acute findings.  Assessment and Plan: Problem List Items Addressed This Visit      Endocrine   Diabetes mellitus, type 2 ([place])    Other Visit Diagnoses    Foot deformity, bilateral    -  Primary   Relevant Orders   DG Foot Complete Right   DG Foot Complete Left   Hammer toes of both feet       Metatarsalgia of both feet       Atrophic plantar fat pad       Foot pain, bilateral          -Examined patient. -Discussed and educated patient on diabetic foot care, especially with  regards to the vascular, neurological and musculoskeletal systems.  -Stressed the importance of good glycemic control and the detriment of not  controlling glucose levels in relation to the foot. -X-rays reviewed and discussed with patient foot deformity especially at toe with dislocation -Patient declined steroid injection and reports that he does not want to even consider any type of surgery at this point -Recommend continue with good supportive shoes cushions insoles and instructed patient on how to tape and splint toe to help with stretching the joint and help to decrease any retrograde pressure on the metatarsal that could be causing pain -Advised patient to refrain from walking barefoot and may try toe spacers that he has at home -[employer] diabetic shoe order form was completed; office to contact primary care for approval / certification;  Office to arrange shoe fitting and dispensing. -Answered all patient questions -Patient to return as  scheduled for diabetic shoe measurements -Patient advised to call the office if any problems or questions arise in the meantime.  Landis Martins, DPM

## 2018-12-06 ENCOUNTER — Ambulatory Visit: Payer: Medicare Other | Admitting: Orthotics

## 2018-12-06 DIAGNOSIS — M2041 Other hammer toe(s) (acquired), right foot: Secondary | ICD-10-CM

## 2018-12-06 DIAGNOSIS — M21962 Unspecified acquired deformity of left lower leg: Principal | ICD-10-CM

## 2018-12-06 DIAGNOSIS — M7741 Metatarsalgia, right foot: Secondary | ICD-10-CM

## 2018-12-06 DIAGNOSIS — M7742 Metatarsalgia, left foot: Secondary | ICD-10-CM

## 2018-12-06 DIAGNOSIS — M21961 Unspecified acquired deformity of right lower leg: Secondary | ICD-10-CM

## 2018-12-06 DIAGNOSIS — M2042 Other hammer toe(s) (acquired), left foot: Secondary | ICD-10-CM

## 2018-12-06 NOTE — Progress Notes (Signed)
Patient is being seen by Nurse Practicioner, will make appointment with Dr and then we will process order for [employer] boots [id number]  [id number]  Inserts on order with Angela Cox, on hold.

## 2018-12-07 ENCOUNTER — Ambulatory Visit: Payer: Medicare Other

## 2019-03-16 ENCOUNTER — Telehealth: Payer: Self-pay | Admitting: Sports Medicine

## 2019-03-16 NOTE — Telephone Encounter (Signed)
Pt called stating that he is still waiting to hear about the status of his diabetic shoes. He was measured for them back in February and the paperwork was given to his PCP to complete in order for him to get shoes. He said his PCP office is [facility]. Please advise.Marland KitchenMarland Kitchen

## 2020-12-17 ENCOUNTER — Encounter: Payer: Self-pay | Admitting: General Practice

## 2022-12-25 ENCOUNTER — Encounter (HOSPITAL_COMMUNITY): Payer: Self-pay

## 2023-01-17 DEATH — deceased
